# Patient Record
Sex: Male | Born: 1970 | Race: White | Hispanic: No | Marital: Married | Smoking: Current every day smoker
Health system: Southern US, Community
[De-identification: ages and names within clinical notes are randomized; demographics above are authoritative.]

## PROBLEM LIST (undated history)

## (undated) DIAGNOSIS — D751 Secondary polycythemia: Secondary | ICD-10-CM

## (undated) DIAGNOSIS — R7401 Elevation of levels of liver transaminase levels: Secondary | ICD-10-CM

## (undated) DIAGNOSIS — M1990 Unspecified osteoarthritis, unspecified site: Secondary | ICD-10-CM

## (undated) DIAGNOSIS — I10 Essential (primary) hypertension: Secondary | ICD-10-CM

## (undated) DIAGNOSIS — E782 Mixed hyperlipidemia: Secondary | ICD-10-CM

## (undated) DIAGNOSIS — Z1211 Encounter for screening for malignant neoplasm of colon: Secondary | ICD-10-CM

## (undated) DIAGNOSIS — F17200 Nicotine dependence, unspecified, uncomplicated: Secondary | ICD-10-CM

## (undated) HISTORY — DX: Mixed hyperlipidemia: E78.2

## (undated) HISTORY — DX: Secondary polycythemia: D75.1

## (undated) HISTORY — DX: Essential (primary) hypertension: I10

## (undated) HISTORY — DX: Encounter for screening for malignant neoplasm of colon: Z12.11

## (undated) HISTORY — DX: Elevation of levels of liver transaminase levels: R74.01

## (undated) HISTORY — PX: VASECTOMY REVERSAL: SHX243

## (undated) HISTORY — DX: Unspecified osteoarthritis, unspecified site: M19.90

## (undated) HISTORY — PX: OTHER SURGICAL HISTORY: SHX169

## (undated) HISTORY — DX: Nicotine dependence, unspecified, uncomplicated: F17.200

---

## 2007-06-28 ENCOUNTER — Emergency Department (HOSPITAL_COMMUNITY): Admission: EM | Admit: 2007-06-28 | Discharge: 2007-06-29 | Payer: Self-pay | Admitting: Emergency Medicine

## 2010-02-05 ENCOUNTER — Ambulatory Visit: Payer: Self-pay | Admitting: Internal Medicine

## 2017-10-24 DIAGNOSIS — F17210 Nicotine dependence, cigarettes, uncomplicated: Secondary | ICD-10-CM | POA: Insufficient documentation

## 2017-10-31 DIAGNOSIS — M25532 Pain in left wrist: Secondary | ICD-10-CM | POA: Insufficient documentation

## 2018-01-20 ENCOUNTER — Ambulatory Visit: Payer: Managed Care, Other (non HMO) | Admitting: Family Medicine

## 2018-01-20 ENCOUNTER — Encounter: Payer: Self-pay | Admitting: Family Medicine

## 2018-01-20 VITALS — BP 145/79 | HR 83 | Temp 98.5°F | Resp 16 | Ht 66.5 in | Wt 151.2 lb

## 2018-01-20 DIAGNOSIS — I10 Essential (primary) hypertension: Secondary | ICD-10-CM | POA: Diagnosis not present

## 2018-01-20 MED ORDER — HYDROCHLOROTHIAZIDE 25 MG PO TABS: 25.0000 mg | ORAL_TABLET | Freq: Every day | ORAL | 1 refills | Status: DC

## 2018-01-20 NOTE — Patient Instructions (Signed)
Check blood pressure and heart rate once daily and write numbers down so you can bring in the numbers for review with me at follow up in 1 month. Goal blood pressure is <130/80.

## 2018-01-20 NOTE — Progress Notes (Signed)
Office Note 01/22/2018  CC:  Chief Complaint  Patient presents with  . Establish Care    Previous PCP: Dr. Garry Heater at [facility]  . URI  . Elevated BP    HPI:  Calvin Gomez is a 47 y.o. male who is here to establish care and discuss elevated bp and recent respiratory symptoms. Patient's most recent primary MD: see above. Old records were reviewed prior to or during today's visit.  On 11/03/17 pt was seen by Dr. Claiborne Billings for elevated bp w/out HTN dx.  He was advised a trial off of decongestants and NSAIDs. Monitor ambulatory blood pressures closely. Goal BP < 140/90. RTC with blood pressure diary.  Has had cough/congestion for 3-4 months.  Rattly, productive initially, went to UC last week and was rx'd prednisone for a week and now the cough is dry.  It is less frequent and less severe, has some clear mucous production only occasion. No ST or signif nasal sx's.  Gets similar illness every winter, but usually shorter duration and later in the year. Not taking any otc cough med.  No hemoptysis, fevers, or wt loss.  He is a current smoker but wants to try quitting-->has chantix already at home but not sure if he is ready to actually try the med at this time.  About the same time as onset of this cough, his bp was found to be elevated when he was seen in the ED for yellow jacket stings. Up to 150s over 110 per pt's report.  Other times bp 130/80.  Mostly 140s/90s since that time with checking bp at home.    Past Medical History:  Diagnosis Date  . Elevated blood pressure reading without diagnosis of hypertension   . Tobacco dependence     Past Surgical History:  Procedure Laterality Date  . vasectomy    . VASECTOMY REVERSAL      Family History  Problem Relation Age of Onset  . Cancer Mother        Peritoneal Cancer  . Hypertension Father   . Asthma Sister   . Alcohol abuse Brother   . Depression Brother   . Drug abuse Brother   . Early death Brother   .  Heart attack Maternal Grandmother   . Stroke Maternal Grandfather     Social History   Socioeconomic History  . Marital status: Married    Spouse name: Not on file  . Number of children: Not on file  . Years of education: Not on file  . Highest education level: Not on file  Occupational History  . Not on file  Social Needs  . Financial resource strain: Not on file  . Food insecurity:    Worry: Not on file    Inability: Not on file  . Transportation needs:    Medical: Not on file    Non-medical: Not on file  Tobacco Use  . Smoking status: Current Every Day Smoker    Packs/day: 1.00    Years: 20.00    Pack years: 20.00    Types: Cigarettes  . Smokeless tobacco: Never Used  Substance and Sexual Activity  . Alcohol use: Yes    Alcohol/week: 14.0 standard drinks    Types: 14 Shots of liquor per week  . Drug use: Never  . Sexual activity: Not on file  Lifestyle  . Physical activity:    Days per week: Not on file    Minutes per session: Not on file  . Stress:  Not on file  Relationships  . Social connections:    Talks on phone: Not on file    Gets together: Not on file    Attends religious service: Not on file    Active member of club or organization: Not on file    Attends meetings of clubs or organizations: Not on file    Relationship status: Not on file  . Intimate partner violence:    Fear of current or ex partner: Not on file    Emotionally abused: Not on file    Physically abused: Not on file    Forced sexual activity: Not on file  Other Topics Concern  . Not on file  Social History Narrative   Married, 3 girls, 1 boy, 1 girl on the way as of 01/2018.   Educ: some college   Occup: sales with [employer]   Tob: 20 pack-yr hx   Alc: about 2 liquor drinks per night.   No drugs.   MEDS: none  No Known Allergies  ROS Review of Systems  Constitutional: Negative for fatigue and fever.  HENT: Negative for congestion and sore throat.   Eyes: Negative for  visual disturbance.  Respiratory: Negative for cough.   Cardiovascular: Negative for chest pain.  Gastrointestinal: Negative for abdominal pain and nausea.  Genitourinary: Negative for dysuria.  Musculoskeletal: Negative for back pain and joint swelling.  Skin: Negative for rash.  Neurological: Negative for weakness and headaches.  Hematological: Negative for adenopathy.    PE; Blood pressure (!) 145/79, pulse 83, temperature 98.5 F (36.9 C), temperature source Oral, resp. rate 16, height 5' 6.5" (1.689 m), weight 151 lb 4 oz (68.6 kg), SpO2 95 %. Body mass index is 24.05 kg/m.  Gen: Alert, well appearing.  Patient is oriented to person, place, time, and situation. AFFECT: pleasant, lucid thought and speech. EZM:OQHU: no injection, icteris, swelling, or exudate.  EOMI, PERRLA. Mouth: lips without lesion/swelling.  Oral mucosa pink and moist. Oropharynx without erythema, exudate, or swelling.  Neck - No masses or thyromegaly or limitation in range of motion CV: RRR, no m/r/g.   LUNGS: CTA bilat, nonlabored resps, good aeration in all lung fields. ABD: soft, NT/ND EXT: no clubbing or cyanosis.  no edema.   Pertinent labs:  none  ASSESSMENT AND PLAN:   New pt; old records available/reviewed in EMR.  1) New dx HTN; start hctz 25mg  qd.  BMET today.  Monitor bp at home, instructions and handout given.  Encouraged smoking cessation: he has chantix already at home (see HPI).  An After Visit Summary was printed and given to the patient.  Return in about 4 weeks (around 02/17/2018) for f/u HTN.  Signed:  Crissie Sickles, MD           01/22/2018

## 2018-01-21 LAB — COMPREHENSIVE METABOLIC PANEL
AG Ratio: 1.5 (calc) (ref 1.0–2.5)
ALBUMIN MSPROF: 4.1 g/dL (ref 3.6–5.1)
ALT: 47 U/L — ABNORMAL HIGH (ref 9–46)
AST: 37 U/L (ref 10–40)
Alkaline phosphatase (APISO): 92 U/L (ref 40–115)
BILIRUBIN TOTAL: 0.9 mg/dL (ref 0.2–1.2)
BUN: 12 mg/dL (ref 7–25)
CO2: 28 mmol/L (ref 20–32)
Calcium: 9.3 mg/dL (ref 8.6–10.3)
Chloride: 105 mmol/L (ref 98–110)
Creat: 0.9 mg/dL (ref 0.60–1.35)
Globulin: 2.8 g/dL (calc) (ref 1.9–3.7)
Glucose, Bld: 88 mg/dL (ref 65–99)
POTASSIUM: 4.7 mmol/L (ref 3.5–5.3)
Sodium: 140 mmol/L (ref 135–146)
TOTAL PROTEIN: 6.9 g/dL (ref 6.1–8.1)

## 2018-01-21 LAB — TSH: TSH: 1.8 mIU/L (ref 0.40–4.50)

## 2018-01-24 ENCOUNTER — Telehealth: Payer: Self-pay | Admitting: *Deleted

## 2018-01-24 ENCOUNTER — Encounter: Payer: Self-pay | Admitting: *Deleted

## 2018-01-24 NOTE — Telephone Encounter (Signed)
Received medical records from [facility] - Dr. Scarlette Ar.  I reviewed records and abstracted information into pts chart.   Records have been placed on Dr. Idelle Leech desk for review.

## 2018-02-13 ENCOUNTER — Ambulatory Visit: Payer: Managed Care, Other (non HMO) | Admitting: Family Medicine

## 2018-02-13 ENCOUNTER — Encounter: Payer: Self-pay | Admitting: Family Medicine

## 2018-02-13 VITALS — BP 115/73 | HR 87 | Temp 98.2°F | Resp 16 | Ht 66.5 in | Wt 147.5 lb

## 2018-02-13 DIAGNOSIS — Z20828 Contact with and (suspected) exposure to other viral communicable diseases: Secondary | ICD-10-CM

## 2018-02-13 MED ORDER — OSELTAMIVIR PHOSPHATE 75 MG PO CAPS: 75.0000 mg | ORAL_CAPSULE | Freq: Every day | ORAL | 0 refills | Status: DC

## 2018-02-13 NOTE — Progress Notes (Signed)
OFFICE VISIT  02/13/2018   CC:  Chief Complaint  Patient presents with  . Flu exposure    needs Rx for tamiflu due wife scheduled for C-section tomorrow OB/GYN told him to get on tamiflu since pts other children have been Dx w/ flu    HPI:    Patient is a 47 y.o.  male who presents for preventative measures.  He says his 2 children were dx'd with flu and pt has been taking care of them. His wife is scheduled to get a C section tomorrow--her GYN rx'd her tamiflu preventatively.  He needs preventative tamiflu. He has had a mild cough the last couple days and woke up with ST today but this resolved quickly. No fever, no body aches, no HA.  Says he feels well now.   Past Medical History:  Diagnosis Date  . Hypertension   . Tobacco dependence     Past Surgical History:  Procedure Laterality Date  . vasectomy    . VASECTOMY REVERSAL      Outpatient Medications Prior to Visit  Medication Sig Dispense Refill  . hydrochlorothiazide (HYDRODIURIL) 25 MG tablet Take 1 tablet (25 mg total) by mouth daily. 30 tablet 1   No facility-administered medications prior to visit.     No Known Allergies  ROS As per HPI  PE: Blood pressure 115/73, pulse 87, temperature 98.2 F (36.8 C), temperature source Oral, resp. rate 16, height 5' 6.5" (1.689 m), weight 147 lb 8 oz (66.9 kg), SpO2 97 %. Gen: Alert, well appearing.  Patient is oriented to person, place, time, and situation. TGG:YIRS: no injection, icteris, swelling, or exudate.  EOMI, PERRLA. Mouth: lips without lesion/swelling.  Oral mucosa pink and moist. Oropharynx with mild diffuse erythema but no exudate, asymmetry, or swelling.  Neck - No masses or thyromegaly or limitation in range of motion CV: RRR, no m/r/g.   LUNGS: CTA bilat, nonlabored resps, good aeration in all lung fields. EXT: no clubbing or cyanosis.  no edema.     LABS:    Chemistry      Component Value Date/Time   NA 140 01/20/2018 1522   K 4.7 01/20/2018  1522   CL 105 01/20/2018 1522   CO2 28 01/20/2018 1522   BUN 12 01/20/2018 1522   CREATININE 0.90 01/20/2018 1522      Component Value Date/Time   CALCIUM 9.3 01/20/2018 1522   AST 37 01/20/2018 1522   ALT 47 (H) 01/20/2018 1522   BILITOT 0.9 01/20/2018 1522       IMPRESSION AND PLAN:  Influenza exposure. Pt's wife getting C section tomorrow. Start tamiflu 75mg  qd x 10d for pt today.  An After Visit Summary was printed and given to the patient.  FOLLOW UP: Return for keep appt for later this week.  Signed:  Crissie Sickles, MD           02/13/2018

## 2018-02-17 ENCOUNTER — Ambulatory Visit: Payer: 59 | Admitting: Family Medicine

## 2018-02-17 ENCOUNTER — Encounter: Payer: Self-pay | Admitting: Family Medicine

## 2018-02-17 VITALS — BP 134/85 | HR 62 | Temp 97.7°F | Resp 16 | Ht 66.5 in | Wt 149.5 lb

## 2018-02-17 DIAGNOSIS — J209 Acute bronchitis, unspecified: Secondary | ICD-10-CM | POA: Diagnosis not present

## 2018-02-17 DIAGNOSIS — Z720 Tobacco use: Secondary | ICD-10-CM | POA: Diagnosis not present

## 2018-02-17 DIAGNOSIS — I10 Essential (primary) hypertension: Secondary | ICD-10-CM | POA: Diagnosis not present

## 2018-02-17 DIAGNOSIS — J069 Acute upper respiratory infection, unspecified: Secondary | ICD-10-CM | POA: Diagnosis not present

## 2018-02-17 MED ORDER — PREDNISONE 20 MG PO TABS: ORAL_TABLET | ORAL | 0 refills | Status: DC

## 2018-02-17 MED ORDER — LISINOPRIL 10 MG PO TABS: 10.0000 mg | ORAL_TABLET | Freq: Every day | ORAL | 0 refills | Status: DC

## 2018-02-17 NOTE — Progress Notes (Signed)
OFFICE VISIT  02/17/2018   CC:  Chief Complaint  Patient presents with  . Follow-up    HTN   HPI:    Patient is a 48 y.o. Caucasian male who presents for 4 wk f/u HTN. Started hctz 25mg  qd last visit. He recently had another little baby girl!  Measurements outside of medical setting: Upper 130s/low 90s average.  No adverse side effects from med. Still having significant nasal congestion/runny nose, PND, lots of coughing.  No fever, wheezing, or SOB. Mucinex DM not much help.  He continues to smoke cigs.  ROS: no CP, no SOB, no wheezing, no dizziness, no HAs, no rashes, no melena/hematochezia.  No polyuria or polydipsia.  No myalgias or arthralgias.   Past Medical History:  Diagnosis Date  . Hypertension   . Tobacco dependence     Past Surgical History:  Procedure Laterality Date  . vasectomy    . VASECTOMY REVERSAL      Outpatient Medications Prior to Visit  Medication Sig Dispense Refill  . hydrochlorothiazide (HYDRODIURIL) 25 MG tablet Take 1 tablet (25 mg total) by mouth daily. 30 tablet 1  . oseltamivir (TAMIFLU) 75 MG capsule Take 1 capsule (75 mg total) by mouth daily. 10 capsule 0   No facility-administered medications prior to visit.     No Known Allergies  ROS As per HPI  PE: Blood pressure 134/85, pulse 62, temperature 97.7 F (36.5 C), temperature source Oral, resp. rate 16, height 5' 6.5" (1.689 m), weight 149 lb 8 oz (67.8 kg), SpO2 98 %. VS: noted--normal except bp a little up. Gen: alert, NAD, NONTOXIC APPEARING. HEENT: eyes without injection, drainage, or swelling.  Ears: EACs clear, TMs with normal light reflex and landmarks.  Nose: Clear rhinorrhea, with some dried, crusty exudate adherent to mildly injected mucosa.  No purulent d/c.  No paranasal sinus TTP.  No facial swelling.  Throat and mouth without focal lesion.  No pharyngial swelling, erythema, or exudate.   Neck: supple, no LAD.   LUNGS: Rattly insp/exp rhonchi diffusely-->completely  clears with forceful coughing--> CTA bilat, nonlabored resps.   CV: RRR, no m/r/g. EXT: no c/c/e SKIN: no rash   LABS:    Chemistry      Component Value Date/Time   NA 140 01/20/2018 1522   K 4.7 01/20/2018 1522   CL 105 01/20/2018 1522   CO2 28 01/20/2018 1522   BUN 12 01/20/2018 1522   CREATININE 0.90 01/20/2018 1522      Component Value Date/Time   CALCIUM 9.3 01/20/2018 1522   AST 37 01/20/2018 1522   ALT 47 (H) 01/20/2018 1522   BILITOT 0.9 01/20/2018 1522       IMPRESSION AND PLAN:  1) HTN, not ideal control. Continue hctz 25mg  qd. Add lisinopril.  Instructions: Add lisinopril 10mg -->HALF tab once a day.  If average blood pressure not 130 on top and 80 on bottom, then increase to WHOLE tab. Check BMET at f/u in 1 mo.  2) Prolonged URI with acute bronchitis, +tob abuse. Prednisone 40mg  qd x 5d, then 20mg  qd x 5d. Further instructions: Pick up generic over the counter flonase and follow instructions on packaging. Pick up generic over the counter saline nasal spray, use 2 sprays each nostril 3-4 times a day to irrigate and moisturize your nasal passages. I mentioned that he could also try any nonsedating otc antihistamine (without sudafed). Work on quitting smoking!  An After Visit Summary was printed and given to the patient.  FOLLOW UP:  Return in about 4 weeks (around 03/17/2018) for f/u HTN/resp illness/needs BMET.  Signed:  Crissie Sickles, MD           02/17/2018

## 2018-02-17 NOTE — Patient Instructions (Signed)
  Continue HCTZ 25mg  tab once daily. Add lisinopril 10mg -->HALF tab once a day.  If average blood pressure not 130 on top and 80 on bottom, then increase to WHOLE tab.  Pick up generic over the counter flonase and follow instructions on packaging. Pick up generic over the counter saline nasal spray, use 2 sprays each nostril 3-4 times a day to irrigate and moisturize your nasal passages.  Work on quitting smoking!

## 2018-03-12 ENCOUNTER — Other Ambulatory Visit: Payer: Self-pay | Admitting: Family Medicine

## 2018-03-17 ENCOUNTER — Ambulatory Visit: Payer: 59 | Admitting: Family Medicine

## 2018-03-17 ENCOUNTER — Encounter: Payer: Self-pay | Admitting: Family Medicine

## 2018-03-17 VITALS — BP 132/83 | HR 73 | Temp 98.0°F | Resp 16 | Ht 66.5 in | Wt 158.5 lb

## 2018-03-17 DIAGNOSIS — J410 Simple chronic bronchitis: Secondary | ICD-10-CM

## 2018-03-17 DIAGNOSIS — F17200 Nicotine dependence, unspecified, uncomplicated: Secondary | ICD-10-CM

## 2018-03-17 DIAGNOSIS — I10 Essential (primary) hypertension: Secondary | ICD-10-CM

## 2018-03-17 MED ORDER — LISINOPRIL 20 MG PO TABS: 20.0000 mg | ORAL_TABLET | Freq: Every day | ORAL | 0 refills | Status: DC

## 2018-03-17 NOTE — Progress Notes (Signed)
OFFICE VISIT  03/17/2018   CC:  Chief Complaint  Patient presents with  . Follow-up    HTN and Resp. Illness     HPI:    Patient is a 48 y.o.  male who presents for f/u HTN and recent resp illness. I last saw him about a month ago for this.  HTN: last visit I added lisinopril 5mg , with instructions to titrate up to 10mg  qd if bp not at goal of 130/80 consistently.    I also dx'd him with prolonged bronchitis and rx'd him pred 40 x 5, 20 x 5.  Recommended flonase and nonsedating antihistamine w/out sudafed.  He is a smoker.  Interim hx:  Daily home bp's avg 140s/90s and also some 093A systolic.  Lowest 113/76.  No recollection of HR data. Not exercising or eating a great diet.  Congestion/cough is a problem mainly only in the morning at this time.  He has no chest tightness or SOB. Occ wheeze that lasts only a few seconds. +smoking still.  Past Medical History:  Diagnosis Date  . Hypertension   . Tobacco dependence     Past Surgical History:  Procedure Laterality Date  . vasectomy    . VASECTOMY REVERSAL      Outpatient Medications Prior to Visit  Medication Sig Dispense Refill  . hydrochlorothiazide (HYDRODIURIL) 25 MG tablet Take 1 tablet (25 mg total) by mouth daily. 30 tablet 1  . lisinopril (PRINIVIL,ZESTRIL) 10 MG tablet TAKE 1 TABLET BY MOUTH EVERY DAY 30 tablet 0  . oseltamivir (TAMIFLU) 75 MG capsule Take 1 capsule (75 mg total) by mouth daily. (Patient not taking: Reported on 03/17/2018) 10 capsule 0  . predniSONE (DELTASONE) 20 MG tablet 2 tabs po qd x 5d, then 1 tab po qd x 5d (Patient not taking: Reported on 03/17/2018) 15 tablet 0   No facility-administered medications prior to visit.     No Known Allergies  ROS As per HPI  PE: Blood pressure 132/83, pulse 73, temperature 98 F (36.7 C), temperature source Oral, resp. rate 16, height 5' 6.5" (1.689 m), weight 158 lb 8 oz (71.9 kg), SpO2 100 %. Gen: Alert, well appearing.  Patient is oriented to  person, place, time, and situation. AFFECT: pleasant, lucid thought and speech. TFT:DDUK: no injection, icteris, swelling, or exudate.  EOMI, PERRLA. Mouth: lips without lesion/swelling.  Oral mucosa pink and moist. Oropharynx without erythema, exudate, or swelling.  CV: RRR, no m/r/g.   LUNGS: CTA bilat, nonlabored resps, good aeration in all lung fields. EXT: no clubbing or cyanosis.  no edema.    LABS:    Chemistry      Component Value Date/Time   NA 140 01/20/2018 1522   K 4.7 01/20/2018 1522   CL 105 01/20/2018 1522   CO2 28 01/20/2018 1522   BUN 12 01/20/2018 1522   CREATININE 0.90 01/20/2018 1522      Component Value Date/Time   CALCIUM 9.3 01/20/2018 1522   AST 37 01/20/2018 1522   ALT 47 (H) 01/20/2018 1522   BILITOT 0.9 01/20/2018 1522       IMPRESSION AND PLAN:  1) uncontrolled htn: increase lisinopril to 20 mg qd. Encouraged smoking cessation, increase exercise, and eat low Na diet. BMET today. Continue daily home bp monitoring. Goal bp <130/80.  2) Chronic bronchitis: I think he is over the recent acute flare. He essentially has smoker's bronchitis and I told him he will continue to have current sx's and recurrent flares of acute  bronchitis as long as he continues to smoke cigarettes.  He expressed understanding but has no plans to attempt quitting at this time.  An After Visit Summary was printed and given to the patient.  FOLLOW UP: Return in about 4 weeks (around 04/14/2018) for f/u HTN.  Signed:  Crissie Sickles, MD           03/17/2018

## 2018-03-18 ENCOUNTER — Other Ambulatory Visit: Payer: Self-pay | Admitting: Family Medicine

## 2018-03-18 LAB — BASIC METABOLIC PANEL
BUN: 13 mg/dL (ref 7–25)
CO2: 28 mmol/L (ref 20–32)
Calcium: 10.2 mg/dL (ref 8.6–10.3)
Chloride: 98 mmol/L (ref 98–110)
Creat: 0.92 mg/dL (ref 0.60–1.35)
Glucose, Bld: 80 mg/dL (ref 65–99)
Potassium: 4.5 mmol/L (ref 3.5–5.3)
Sodium: 135 mmol/L (ref 135–146)

## 2018-03-20 ENCOUNTER — Encounter: Payer: Self-pay | Admitting: *Deleted

## 2018-04-14 ENCOUNTER — Ambulatory Visit: Payer: 59 | Admitting: Family Medicine

## 2018-04-15 ENCOUNTER — Other Ambulatory Visit: Payer: Self-pay | Admitting: Family Medicine

## 2018-04-17 NOTE — Telephone Encounter (Signed)
Received 2 refill requests for BP meds (Lisinopril and HCTZ) from patient's pharmacy. Called patient to make sure he had enough and confirmed he did.

## 2018-04-20 ENCOUNTER — Ambulatory Visit: Payer: 59 | Admitting: Family Medicine

## 2018-04-20 NOTE — Progress Notes (Deleted)
OFFICE VISIT  04/20/2018   CC: No chief complaint on file.    HPI:    Patient is a 48 y.o. Caucasian male who presents for 1 month f/u HTN. Last visit his bp was not at goal so I increased lisinopril from 10mg  to 20mg  daily. BMET at that time was normal.  Interim Hx: ***  Past Medical History:  Diagnosis Date  . Hypertension   . Tobacco dependence     Past Surgical History:  Procedure Laterality Date  . vasectomy    . VASECTOMY REVERSAL      Outpatient Medications Prior to Visit  Medication Sig Dispense Refill  . hydrochlorothiazide (HYDRODIURIL) 25 MG tablet TAKE 1 TABLET BY MOUTH EVERY DAY 30 tablet 0  . lisinopril (PRINIVIL,ZESTRIL) 20 MG tablet Take 1 tablet (20 mg total) by mouth daily. 30 tablet 0   No facility-administered medications prior to visit.     No Known Allergies  ROS As per HPI  PE: There were no vitals taken for this visit. ***  LABS:    Chemistry      Component Value Date/Time   NA 135 03/17/2018 1027   K 4.5 03/17/2018 1027   CL 98 03/17/2018 1027   CO2 28 03/17/2018 1027   BUN 13 03/17/2018 1027   CREATININE 0.92 03/17/2018 1027      Component Value Date/Time   CALCIUM 10.2 03/17/2018 1027   AST 37 01/20/2018 1522   ALT 47 (H) 01/20/2018 1522   BILITOT 0.9 01/20/2018 1522       IMPRESSION AND PLAN:  No problem-specific Assessment & Plan notes found for this encounter.   An After Visit Summary was printed and given to the patient.  FOLLOW UP: No follow-ups on file.  Signed:  Crissie Sickles, MD           04/20/2018

## 2018-04-24 ENCOUNTER — Ambulatory Visit: Payer: 59 | Admitting: Family Medicine

## 2018-04-24 ENCOUNTER — Encounter: Payer: Self-pay | Admitting: Family Medicine

## 2018-04-24 VITALS — BP 105/69 | HR 83 | Temp 98.5°F | Resp 16 | Ht 66.5 in | Wt 154.8 lb

## 2018-04-24 DIAGNOSIS — I10 Essential (primary) hypertension: Secondary | ICD-10-CM | POA: Diagnosis not present

## 2018-04-24 NOTE — Patient Instructions (Signed)
DASH Eating Plan  DASH stands for "Dietary Approaches to Stop Hypertension." The DASH eating plan is a healthy eating plan that has been shown to reduce high blood pressure (hypertension). It may also reduce your risk for type 2 diabetes, heart disease, and stroke. The DASH eating plan may also help with weight loss.  What are tips for following this plan?    General guidelines   Avoid eating more than 2,300 mg (milligrams) of salt (sodium) a day. If you have hypertension, you may need to reduce your sodium intake to 1,500 mg a day.   Limit alcohol intake to no more than 1 drink a day for nonpregnant women and 2 drinks a day for men. One drink equals 12 oz of beer, 5 oz of wine, or 1 oz of hard liquor.   Work with your health care provider to maintain a healthy body weight or to lose weight. Ask what an ideal weight is for you.   Get at least 30 minutes of exercise that causes your heart to beat faster (aerobic exercise) most days of the week. Activities may include walking, swimming, or biking.   Work with your health care provider or diet and nutrition specialist (dietitian) to adjust your eating plan to your individual calorie needs.  Reading food labels     Check food labels for the amount of sodium per serving. Choose foods with less than 5 percent of the Daily Value of sodium. Generally, foods with less than 300 mg of sodium per serving fit into this eating plan.   To find whole grains, look for the word "whole" as the first word in the ingredient list.  Shopping   Buy products labeled as "low-sodium" or "no salt added."   Buy fresh foods. Avoid canned foods and premade or frozen meals.  Cooking   Avoid adding salt when cooking. Use salt-free seasonings or herbs instead of table salt or sea salt. Check with your health care provider or pharmacist before using salt substitutes.   Do not fry foods. Cook foods using healthy methods such as baking, boiling, grilling, and broiling instead.   Cook with  heart-healthy oils, such as olive, canola, soybean, or sunflower oil.  Meal planning   Eat a balanced diet that includes:  ? 5 or more servings of fruits and vegetables each day. At each meal, try to fill half of your plate with fruits and vegetables.  ? Up to 6-8 servings of whole grains each day.  ? Less than 6 oz of lean meat, poultry, or fish each day. A 3-oz serving of meat is about the same size as a deck of cards. One egg equals 1 oz.  ? 2 servings of low-fat dairy each day.  ? A serving of nuts, seeds, or beans 5 times each week.  ? Heart-healthy fats. Healthy fats called Omega-3 fatty acids are found in foods such as flaxseeds and coldwater fish, like sardines, salmon, and mackerel.   Limit how much you eat of the following:  ? Canned or prepackaged foods.  ? Food that is high in trans fat, such as fried foods.  ? Food that is high in saturated fat, such as fatty meat.  ? Sweets, desserts, sugary drinks, and other foods with added sugar.  ? Full-fat dairy products.   Do not salt foods before eating.   Try to eat at least 2 vegetarian meals each week.   Eat more home-cooked food and less restaurant, buffet, and fast food.     When eating at a restaurant, ask that your food be prepared with less salt or no salt, if possible.  What foods are recommended?  The items listed may not be a complete list. Talk with your dietitian about what dietary choices are best for you.  Grains  Whole-grain or whole-wheat bread. Whole-grain or whole-wheat pasta. Brown rice. Oatmeal. Quinoa. Bulgur. Whole-grain and low-sodium cereals. Pita bread. Low-fat, low-sodium crackers. Whole-wheat flour tortillas.  Vegetables  Fresh or frozen vegetables (raw, steamed, roasted, or grilled). Low-sodium or reduced-sodium tomato and vegetable juice. Low-sodium or reduced-sodium tomato sauce and tomato paste. Low-sodium or reduced-sodium canned vegetables.  Fruits  All fresh, dried, or frozen fruit. Canned fruit in natural juice (without  added sugar).  Meat and other protein foods  Skinless chicken or turkey. Ground chicken or turkey. Pork with fat trimmed off. Fish and seafood. Egg whites. Dried beans, peas, or lentils. Unsalted nuts, nut butters, and seeds. Unsalted canned beans. Lean cuts of beef with fat trimmed off. Low-sodium, lean deli meat.  Dairy  Low-fat (1%) or fat-free (skim) milk. Fat-free, low-fat, or reduced-fat cheeses. Nonfat, low-sodium ricotta or cottage cheese. Low-fat or nonfat yogurt. Low-fat, low-sodium cheese.  Fats and oils  Soft margarine without trans fats. Vegetable oil. Low-fat, reduced-fat, or light mayonnaise and salad dressings (reduced-sodium). Canola, safflower, olive, soybean, and sunflower oils. Avocado.  Seasoning and other foods  Herbs. Spices. Seasoning mixes without salt. Unsalted popcorn and pretzels. Fat-free sweets.  What foods are not recommended?  The items listed may not be a complete list. Talk with your dietitian about what dietary choices are best for you.  Grains  Baked goods made with fat, such as croissants, muffins, or some breads. Dry pasta or rice meal packs.  Vegetables  Creamed or fried vegetables. Vegetables in a cheese sauce. Regular canned vegetables (not low-sodium or reduced-sodium). Regular canned tomato sauce and paste (not low-sodium or reduced-sodium). Regular tomato and vegetable juice (not low-sodium or reduced-sodium). Pickles. Olives.  Fruits  Canned fruit in a light or heavy syrup. Fried fruit. Fruit in cream or butter sauce.  Meat and other protein foods  Fatty cuts of meat. Ribs. Fried meat. Bacon. Sausage. Bologna and other processed lunch meats. Salami. Fatback. Hotdogs. Bratwurst. Salted nuts and seeds. Canned beans with added salt. Canned or smoked fish. Whole eggs or egg yolks. Chicken or turkey with skin.  Dairy  Whole or 2% milk, cream, and half-and-half. Whole or full-fat cream cheese. Whole-fat or sweetened yogurt. Full-fat cheese. Nondairy creamers. Whipped toppings.  Processed cheese and cheese spreads.  Fats and oils  Butter. Stick margarine. Lard. Shortening. Ghee. Bacon fat. Tropical oils, such as coconut, palm kernel, or palm oil.  Seasoning and other foods  Salted popcorn and pretzels. Onion salt, garlic salt, seasoned salt, table salt, and sea salt. Worcestershire sauce. Tartar sauce. Barbecue sauce. Teriyaki sauce. Soy sauce, including reduced-sodium. Steak sauce. Canned and packaged gravies. Fish sauce. Oyster sauce. Cocktail sauce. Horseradish that you find on the shelf. Ketchup. Mustard. Meat flavorings and tenderizers. Bouillon cubes. Hot sauce and Tabasco sauce. Premade or packaged marinades. Premade or packaged taco seasonings. Relishes. Regular salad dressings.  Where to find more information:   National Heart, Lung, and Blood Institute: www.nhlbi.nih.gov   American Heart Association: www.heart.org  Summary   The DASH eating plan is a healthy eating plan that has been shown to reduce high blood pressure (hypertension). It may also reduce your risk for type 2 diabetes, heart disease, and stroke.   With the   DASH eating plan, you should limit salt (sodium) intake to 2,300 mg a day. If you have hypertension, you may need to reduce your sodium intake to 1,500 mg a day.   When on the DASH eating plan, aim to eat more fresh fruits and vegetables, whole grains, lean proteins, low-fat dairy, and heart-healthy fats.   Work with your health care provider or diet and nutrition specialist (dietitian) to adjust your eating plan to your individual calorie needs.  This information is not intended to replace advice given to you by your health care provider. Make sure you discuss any questions you have with your health care provider.  Document Released: 01/21/2011 Document Revised: 01/26/2016 Document Reviewed: 01/26/2016  Elsevier Interactive Patient Education  2019 [employer] Inc[employer]

## 2018-04-24 NOTE — Progress Notes (Signed)
OFFICE VISIT  04/24/2018   CC:  Chief Complaint  Patient presents with  . Follow-up    hypertension     HPI:    Patient is a 48 y.o. Caucasian male who presents for 1 mo f/u HTN. Increased lisinopril to 20mg  qd last visit. BMET at that time was normal.  Home bp monitoring showed great reponse 118-125 over 70s-80. No adverse effects from the meds.  No acute complaints.   Discussed low sodium/DASH diet, handout given. Still smoking.  Past Medical History:  Diagnosis Date  . Hypertension   . Tobacco dependence     Past Surgical History:  Procedure Laterality Date  . vasectomy    . VASECTOMY REVERSAL      Outpatient Medications Prior to Visit  Medication Sig Dispense Refill  . hydrochlorothiazide (HYDRODIURIL) 25 MG tablet TAKE 1 TABLET BY MOUTH EVERY DAY 30 tablet 0  . lisinopril (PRINIVIL,ZESTRIL) 20 MG tablet Take 1 tablet (20 mg total) by mouth daily. 30 tablet 0   No facility-administered medications prior to visit.     No Known Allergies  ROS As per HPI  PE: Blood pressure 105/69, pulse 83, temperature 98.5 F (36.9 C), temperature source Oral, resp. rate 16, height 5' 6.5" (1.689 m), weight 154 lb 12.8 oz (70.2 kg), SpO2 98 %. Body mass index is 24.61 kg/m.  Gen: Alert, well appearing.  Patient is oriented to person, place, time, and situation. AFFECT: pleasant, lucid thought and speech. CV: RRR, no m/r/g.   LUNGS: CTA bilat, nonlabored resps, good aeration in all lung fields. EXT: no clubbing or cyanosis.  no edema.    LABS:    Chemistry      Component Value Date/Time   NA 135 03/17/2018 1027   K 4.5 03/17/2018 1027   CL 98 03/17/2018 1027   CO2 28 03/17/2018 1027   BUN 13 03/17/2018 1027   CREATININE 0.92 03/17/2018 1027      Component Value Date/Time   CALCIUM 10.2 03/17/2018 1027   AST 37 01/20/2018 1522   ALT 47 (H) 01/20/2018 1522   BILITOT 0.9 01/20/2018 1522       IMPRESSION AND PLAN:  Ess HTN: well controlled on current  regimen. No labs today. RF'd hctz 25mg  qd and lisinopril 20mg  qd today. DASH diet reviewed, handout given. Encouraged pt to increase exercise and totally quit smoking.  An After Visit Summary was printed and given to the patient.  FOLLOW UP: Return in about 6 months (around 10/25/2018) for annual CPE (fasting).  Signed:  Crissie Sickles, MD           04/24/2018

## 2018-04-27 ENCOUNTER — Encounter: Payer: Self-pay | Admitting: Family Medicine

## 2018-04-27 NOTE — Telephone Encounter (Signed)
Please advise. Thanks.  

## 2018-04-28 MED ORDER — VARENICLINE TARTRATE 0.5 MG PO TABS: 0.5000 mg | ORAL_TABLET | Freq: Two times a day (BID) | ORAL | 0 refills | Status: DC

## 2018-04-28 NOTE — Telephone Encounter (Signed)
Will you pls send in chantix "starter pack" to pt's pharmacy. Tell pt to call and request a "continuation prescription" if he is still on the med at the end of his starter pack.-thx

## 2018-05-18 ENCOUNTER — Other Ambulatory Visit: Payer: Self-pay | Admitting: Family Medicine

## 2018-05-19 ENCOUNTER — Other Ambulatory Visit: Payer: Self-pay

## 2018-05-19 MED ORDER — HYDROCHLOROTHIAZIDE 25 MG PO TABS: 25.0000 mg | ORAL_TABLET | Freq: Every day | ORAL | 3 refills | Status: DC

## 2018-05-19 MED ORDER — LISINOPRIL 20 MG PO TABS: 20.0000 mg | ORAL_TABLET | Freq: Every day | ORAL | 3 refills | Status: DC

## 2018-06-24 ENCOUNTER — Other Ambulatory Visit: Payer: Self-pay | Admitting: Family Medicine

## 2018-06-26 NOTE — Telephone Encounter (Signed)
I'm really confused:  I rx'd a chantix started pack 04/2018.  Ask pt, did he take the starter pack?  This RF request has 0.5mg  bid dosing on it, but the maintenance dose is 1 mg bid.   Let me know-thx

## 2018-06-27 NOTE — Telephone Encounter (Signed)
Called pt and LM to return call to find out if pt has been taking medication (starter pack) and is needing the maintenance dose

## 2018-09-27 ENCOUNTER — Encounter: Payer: Self-pay | Admitting: Family Medicine

## 2018-09-27 ENCOUNTER — Other Ambulatory Visit: Payer: Self-pay

## 2018-09-27 ENCOUNTER — Ambulatory Visit (INDEPENDENT_AMBULATORY_CARE_PROVIDER_SITE_OTHER): Payer: No Typology Code available for payment source | Admitting: Family Medicine

## 2018-09-27 VITALS — BP 136/91 | HR 74 | Temp 97.8°F | Wt 157.4 lb

## 2018-09-27 DIAGNOSIS — R1030 Lower abdominal pain, unspecified: Secondary | ICD-10-CM | POA: Diagnosis not present

## 2018-09-27 DIAGNOSIS — Z720 Tobacco use: Secondary | ICD-10-CM

## 2018-09-27 DIAGNOSIS — R197 Diarrhea, unspecified: Secondary | ICD-10-CM | POA: Diagnosis not present

## 2018-09-27 DIAGNOSIS — R05 Cough: Secondary | ICD-10-CM | POA: Diagnosis not present

## 2018-09-27 DIAGNOSIS — R053 Chronic cough: Secondary | ICD-10-CM

## 2018-09-27 MED ORDER — BENZONATATE 200 MG PO CAPS: 200.0000 mg | ORAL_CAPSULE | Freq: Two times a day (BID) | ORAL | 2 refills | Status: DC | PRN

## 2018-09-27 NOTE — Progress Notes (Signed)
Virtual Visit via Video Note  I connected with pt on 09/27/18 at 10:00 AM EDT by a video enabled telemedicine application and verified that I am speaking with the correct person using two identifiers.  Location patient: home Location provider:work or home office Persons participating in the virtual visit: patient, provider  I discussed the limitations of evaluation and management by telemedicine and the availability of in person appointments. The patient expressed understanding and agreed to proceed.  Telemedicine visit is a necessity given the COVID-19 restrictions in place at the current time.  HPI: 48 y/o male being seen today for  abd pain and diarrhea. Onset 2 wks ago diffuse lower abd pain and loose stools.  Says lower abd is "tender" but no bloating or cramping sensation.  No blood in stools.  Has 3-4 BMs per day. Last few days drinking more water, less soda.  Urination is normal. Appetite down, sometimes eats only 1 meal per day.  No n/v/fever. No known sick contacts. Has never had this type of problem before.    Has chronic cough that causes lower abd pain to be worse. Longtime smoker.  Says he has "wheezing" which per his description sounds like rhonchorus sounds that clear with brief coughing.  No SOB or hemoptysis. Has taken prednis in the past but no help. OTC cough meds no help.   ROS: no CP, no SOB, no dizziness, no HAs, no rashes, no melena/hematochezia.  No polyuria or polydipsia.  No myalgias or arthralgias.   Past Medical History:  Diagnosis Date  . Hypertension   . Tobacco dependence     Past Surgical History:  Procedure Laterality Date  . vasectomy    . VASECTOMY REVERSAL      Family History  Problem Relation Age of Onset  . Cancer Mother        Peritoneal Cancer  . Hypertension Father   . Stroke Father   . Asthma Sister   . Alcohol abuse Brother   . Depression Brother   . Drug abuse Brother   . Early death Brother   . Heart attack Maternal  Grandmother   . Stroke Maternal Grandmother   . Stroke Maternal Grandfather   . Heart disease Maternal Grandfather   . Asthma Daughter     SOCIAL HX:  Social History   Socioeconomic History  . Marital status: Married    Spouse name: Not on file  . Number of children: Not on file  . Years of education: Not on file  . Highest education level: Not on file  Occupational History  . Not on file  Social Needs  . Financial resource strain: Not on file  . Food insecurity    Worry: Not on file    Inability: Not on file  . Transportation needs    Medical: Not on file    Non-medical: Not on file  Tobacco Use  . Smoking status: Current Every Day Smoker    Packs/day: 1.00    Years: 20.00    Pack years: 20.00    Types: Cigarettes  . Smokeless tobacco: Never Used  Substance and Sexual Activity  . Alcohol use: Yes    Alcohol/week: 14.0 standard drinks    Types: 14 Shots of liquor per week  . Drug use: Never  . Sexual activity: Not on file  Lifestyle  . Physical activity    Days per week: Not on file    Minutes per session: Not on file  . Stress: Not on file  Relationships  . Social Herbalist on phone: Not on file    Gets together: Not on file    Attends religious service: Not on file    Active member of club or organization: Not on file    Attends meetings of clubs or organizations: Not on file    Relationship status: Not on file  Other Topics Concern  . Not on file  Social History Narrative   Married, 4 girls, 1 boy.   Educ: some college   Occup: sales with [employer]   Tob: 20 pack-yr hx, current as of 02/2018.   Alc: about 2 liquor drinks per night.   No drugs.      Current Outpatient Medications:  .  hydrochlorothiazide (HYDRODIURIL) 25 MG tablet, Take 1 tablet (25 mg total) by mouth daily., Disp: 30 tablet, Rfl: 3 .  lisinopril (ZESTRIL) 10 MG tablet, Take 10 mg by mouth once. Pt takes 2 tablets daily., Disp: , Rfl:  .  lisinopril (PRINIVIL,ZESTRIL)  20 MG tablet, Take 1 tablet (20 mg total) by mouth daily. (Patient not taking: Reported on 09/27/2018), Disp: 30 tablet, Rfl: 3 .  varenicline (CHANTIX) 0.5 MG tablet, Take 1 tablet (0.5 mg total) by mouth 2 (two) times daily. (Patient not taking: Reported on 09/27/2018), Disp: 60 tablet, Rfl: 0  EXAM:  VITALS per patient if applicable: BP (!) 569/79 (BP Location: Left Arm, Patient Position: Sitting, Cuff Size: Normal)   Pulse 74   Temp 97.8 F (36.6 C) (Oral)   Wt 157 lb 6.4 oz (71.4 kg)   BMI 25.02 kg/m    GENERAL: alert, oriented, appears well and in no acute distress  HEENT: atraumatic, conjunttiva clear, no obvious abnormalities on inspection of external nose and ears  NECK: normal movements of the head and neck  LUNGS: on inspection no signs of respiratory distress, breathing rate appears normal, no obvious gross SOB, gasping or wheezing  CV: no obvious cyanosis ABD: when pt presses on lower abd he has mild TTP in RLQ but nowhere else.  MS: moves all visible extremities without noticeable abnormality  PSYCH/NEURO: pleasant and cooperative, no obvious depression or anxiety, speech and thought processing grossly intact  ASSESSMENT AND PLAN:  Discussed the following assessment and plan:  1) Lower abd pain: colitis vs abd wall pain. Acute/subacute diarrhea associated with this. Will check CBC w/diff, CMET, and stool studies (o&p, giardia, c diff, bact culture). No meds recommended for this problem at this time.  2) Chronic cough.  Suspect chronic upper airway cough from smoking but also likely some chronic bronchitis as well.  Will check CXR--[place].Lavella Lemons pearls 200 mg tid prn eRxd today, #30, rF x 2.  I discussed the assessment and treatment plan with the patient. The patient was provided an opportunity to ask questions and all were answered. The patient agreed with the plan and demonstrated an understanding of the instructions.   The patient was advised to call  back or seek an in-person evaluation if the symptoms worsen or if the condition fails to improve as anticipated.  F/u: will determine when I need to have him come in for exam after I get results of testing.  Signed:  Crissie Sickles, MD           09/27/2018

## 2018-09-29 ENCOUNTER — Ambulatory Visit (INDEPENDENT_AMBULATORY_CARE_PROVIDER_SITE_OTHER): Payer: No Typology Code available for payment source

## 2018-09-29 ENCOUNTER — Other Ambulatory Visit: Payer: No Typology Code available for payment source

## 2018-09-29 DIAGNOSIS — Z720 Tobacco use: Secondary | ICD-10-CM

## 2018-09-29 DIAGNOSIS — R05 Cough: Secondary | ICD-10-CM

## 2018-09-29 DIAGNOSIS — R053 Chronic cough: Secondary | ICD-10-CM

## 2018-10-02 ENCOUNTER — Telehealth: Payer: Self-pay

## 2018-10-02 NOTE — Telephone Encounter (Signed)
MyChart message sent with results.  

## 2018-10-03 ENCOUNTER — Telehealth: Payer: Self-pay

## 2018-10-03 ENCOUNTER — Other Ambulatory Visit: Payer: No Typology Code available for payment source

## 2018-10-03 ENCOUNTER — Ambulatory Visit (INDEPENDENT_AMBULATORY_CARE_PROVIDER_SITE_OTHER): Payer: No Typology Code available for payment source

## 2018-10-03 ENCOUNTER — Other Ambulatory Visit: Payer: Self-pay

## 2018-10-03 DIAGNOSIS — R1030 Lower abdominal pain, unspecified: Secondary | ICD-10-CM | POA: Diagnosis not present

## 2018-10-03 DIAGNOSIS — R7401 Elevation of levels of liver transaminase levels: Secondary | ICD-10-CM

## 2018-10-03 DIAGNOSIS — R197 Diarrhea, unspecified: Secondary | ICD-10-CM | POA: Diagnosis not present

## 2018-10-03 LAB — CBC WITH DIFFERENTIAL/PLATELET
Basophils Absolute: 0.1 10*3/uL (ref 0.0–0.1)
Basophils Relative: 0.8 % (ref 0.0–3.0)
Eosinophils Absolute: 0.4 10*3/uL (ref 0.0–0.7)
Eosinophils Relative: 4.3 % (ref 0.0–5.0)
HCT: 55.1 % — ABNORMAL HIGH (ref 39.0–52.0)
Hemoglobin: 18.6 g/dL (ref 13.0–17.0)
Lymphocytes Relative: 28.5 % (ref 12.0–46.0)
Lymphs Abs: 2.4 10*3/uL (ref 0.7–4.0)
MCHC: 33.8 g/dL (ref 30.0–36.0)
MCV: 98 fl (ref 78.0–100.0)
Monocytes Absolute: 1.1 10*3/uL — ABNORMAL HIGH (ref 0.1–1.0)
Monocytes Relative: 12.9 % — ABNORMAL HIGH (ref 3.0–12.0)
Neutro Abs: 4.5 10*3/uL (ref 1.4–7.7)
Neutrophils Relative %: 53.5 % (ref 43.0–77.0)
Platelets: 211 10*3/uL (ref 150.0–400.0)
RBC: 5.62 Mil/uL (ref 4.22–5.81)
RDW: 14.4 % (ref 11.5–15.5)
WBC: 8.4 10*3/uL (ref 4.0–10.5)

## 2018-10-03 LAB — COMPREHENSIVE METABOLIC PANEL
ALT: 127 U/L — ABNORMAL HIGH (ref 0–53)
AST: 87 U/L — ABNORMAL HIGH (ref 0–37)
Albumin: 4.9 g/dL (ref 3.5–5.2)
Alkaline Phosphatase: 79 U/L (ref 39–117)
BUN: 11 mg/dL (ref 6–23)
CO2: 28 mEq/L (ref 19–32)
Calcium: 10.3 mg/dL (ref 8.4–10.5)
Chloride: 98 mEq/L (ref 96–112)
Creatinine, Ser: 0.88 mg/dL (ref 0.40–1.50)
GFR: 93.17 mL/min (ref >60.00)
Glucose, Bld: 113 mg/dL — ABNORMAL HIGH (ref 70–99)
Potassium: 3.7 mEq/L (ref 3.5–5.1)
Sodium: 135 mEq/L (ref 135–145)
Total Bilirubin: 0.8 mg/dL (ref 0.2–1.2)
Total Protein: 7.7 g/dL (ref 6.0–8.3)

## 2018-10-03 NOTE — Telephone Encounter (Signed)
CRITICAL VALUE STICKER  CRITICAL VALUE: Hgb 18.6  RECEIVER (on-site recipient of call): Deveron Furlong, [place] NOTIFIED: 10/03/18 at 3:34pm  MESSENGER (representative from lab): Santiago Glad  MD NOTIFIED: [place]: 3:40pm  RESPONSE:

## 2018-10-04 LAB — HEPATITIS C ANTIBODY
Hepatitis C Ab: NONREACTIVE
SIGNAL TO CUT-OFF: 0.02 (ref <1.00)

## 2018-10-04 LAB — HEPATITIS B SURFACE ANTIGEN: Hepatitis B Surface Ag: NONREACTIVE

## 2018-10-04 NOTE — Telephone Encounter (Signed)
Noted.  Result note has been done/pt aware.

## 2018-10-06 ENCOUNTER — Other Ambulatory Visit: Payer: Self-pay

## 2018-10-06 ENCOUNTER — Ambulatory Visit: Payer: No Typology Code available for payment source | Admitting: Family Medicine

## 2018-10-06 ENCOUNTER — Encounter: Payer: Self-pay | Admitting: Family Medicine

## 2018-10-06 VITALS — BP 124/77 | HR 71 | Temp 98.5°F | Resp 16 | Ht 66.5 in | Wt 159.4 lb

## 2018-10-06 DIAGNOSIS — J42 Unspecified chronic bronchitis: Secondary | ICD-10-CM

## 2018-10-06 DIAGNOSIS — B349 Viral infection, unspecified: Secondary | ICD-10-CM

## 2018-10-06 DIAGNOSIS — R740 Nonspecific elevation of levels of transaminase and lactic acid dehydrogenase [LDH]: Secondary | ICD-10-CM | POA: Diagnosis not present

## 2018-10-06 DIAGNOSIS — D751 Secondary polycythemia: Secondary | ICD-10-CM | POA: Diagnosis not present

## 2018-10-06 DIAGNOSIS — F17200 Nicotine dependence, unspecified, uncomplicated: Secondary | ICD-10-CM

## 2018-10-06 DIAGNOSIS — R7401 Elevation of levels of liver transaminase levels: Secondary | ICD-10-CM

## 2018-10-06 DIAGNOSIS — R197 Diarrhea, unspecified: Secondary | ICD-10-CM

## 2018-10-06 MED ORDER — CHANTIX STARTING MONTH PAK 0.5 MG X 11 & 1 MG X 42 PO TABS: ORAL_TABLET | ORAL | 0 refills | Status: DC

## 2018-10-06 MED ORDER — VARENICLINE TARTRATE 1 MG PO TABS: 1.0000 mg | ORAL_TABLET | Freq: Two times a day (BID) | ORAL | 1 refills | Status: DC

## 2018-10-06 NOTE — Progress Notes (Signed)
OFFICE VISIT  10/09/2018   CC:  Chief Complaint  Patient presents with  . Abdominal Pain    ABD has gotten better   . Cough    Started in October, productive    HPI:    Patient is a 48 y.o. Caucasian male who presents for f/u diarrhea, lower abd pain, and chronic cough. I saw him 9 days ago and rx'd tessalon pearls. CXR was normal.  I did some blood tests and ordered stool studies.  Labs all normal except Hb 18.6, remainder of CBC all normal, AST and ALT a little over twice normal levels, remainder of CMET normal. Hep B and C screening negative. Patient IS a smoker.  Interim hx: Diarrhea and lower abd pain resolved right after our visit 9 d/a. Has been taking tessalon, still with cough productive of mucous-->mucous worse in mornings. No blood in mucous.  Occ wheezing, brief.  No SOB. He continues to smoke cigarettes, has had some success in the past with using chantix to quit, wants to try this again.  He does snore.  Wife doesn't comment on apneic spells in sleep.  No excessive daytime sleepiness. Works at [employer]  No environment in which carbon monoxide excess is possible. No known fam hx of polycythemia or myelodysplastic disorder.  ROS: no CP, no SOB, no wheezing, no cough, no dizziness, no HAs, no rashes, no melena/hematochezia.  No polyuria or polydipsia.  No myalgias or arthralgias. No redness of face.  No pain on bottoms of feet or in palms of hands.  No acute vision complaints  Past Medical History:  Diagnosis Date  . Hypertension   . Tobacco dependence     Past Surgical History:  Procedure Laterality Date  . vasectomy    . VASECTOMY REVERSAL      Outpatient Medications Prior to Visit  Medication Sig Dispense Refill  . benzonatate (TESSALON) 200 MG capsule Take 1 capsule (200 mg total) by mouth 2 (two) times daily as needed for cough. 30 capsule 2  . hydrochlorothiazide (HYDRODIURIL) 25 MG tablet Take 1 tablet (25 mg total) by mouth daily. 30 tablet 3  .  lisinopril (PRINIVIL,ZESTRIL) 20 MG tablet Take 1 tablet (20 mg total) by mouth daily. 30 tablet 3  . lisinopril (ZESTRIL) 10 MG tablet Take 10 mg by mouth once. Pt takes 2 tablets daily.    . varenicline (CHANTIX) 0.5 MG tablet Take 1 tablet (0.5 mg total) by mouth 2 (two) times daily. (Patient not taking: Reported on 10/06/2018) 60 tablet 0   No facility-administered medications prior to visit.     No Known Allergies  ROS As per HPI  PE: Blood pressure 124/77, pulse 71, temperature 98.5 F (36.9 C), temperature source Temporal, resp. rate 16, height 5' 6.5" (1.689 m), weight 159 lb 6 oz (72.3 kg), SpO2 98 %. Gen: Alert, well appearing.  Patient is oriented to person, place, time, and situation. AFFECT: pleasant, lucid thought and speech. No further exam today.  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  Asymptomatic. Suspect this is from tobacco abuse with recent superimposed mild dehydration/hemoconcentration.  OSA less likely. Repeat cbc today, do peripheral smear review. Consider ferritin/iron panel. If rising Hb or if staying high despite smoking cessation then will ask hematologist to see.  2) Diarrhea illness: resolved.  3) Elevated transaminases--mild.  Stable on last check. Hep B and C neg.  Fatty liver most likely.  ? Transient and related to recent viral syndrome. Keep hepatic inflammation from hemachromatosis in back  of mind given #1.  4) Chronic bronchitis.  Cough stable. Needs to quit smoking.  5) Tob dep: chantix trial started today.  An After Visit Summary was printed and given to the patient.  FOLLOW UP: Return in about 3 months (around 01/06/2019) for routine chronic illness f/u.  Signed:  Crissie Sickles, MD           10/09/2018

## 2018-10-07 LAB — CBC WITH DIFFERENTIAL/PLATELET
Absolute Monocytes: 957 cells/uL — ABNORMAL HIGH (ref 200–950)
Basophils Absolute: 78 cells/uL (ref 0–200)
Basophils Relative: 0.9 %
Eosinophils Absolute: 252 cells/uL (ref 15–500)
Eosinophils Relative: 2.9 %
HCT: 50.7 % — ABNORMAL HIGH (ref 38.5–50.0)
Hemoglobin: 17.5 g/dL — ABNORMAL HIGH (ref 13.2–17.1)
Lymphs Abs: 2714 cells/uL (ref 850–3900)
MCH: 32.5 pg (ref 27.0–33.0)
MCHC: 34.5 g/dL (ref 32.0–36.0)
MCV: 94.2 fL (ref 80.0–100.0)
MPV: 11.6 fL (ref 7.5–12.5)
Monocytes Relative: 11 %
Neutro Abs: 4698 cells/uL (ref 1500–7800)
Neutrophils Relative %: 54 %
Platelets: 223 10*3/uL (ref 140–400)
RBC: 5.38 10*6/uL (ref 4.20–5.80)
RDW: 13.3 % (ref 11.0–15.0)
Total Lymphocyte: 31.2 %
WBC: 8.7 10*3/uL (ref 3.8–10.8)

## 2018-10-07 LAB — COMPREHENSIVE METABOLIC PANEL
AG Ratio: 1.7 (calc) (ref 1.0–2.5)
ALT: 112 U/L — ABNORMAL HIGH (ref 9–46)
AST: 58 U/L — ABNORMAL HIGH (ref 10–40)
Albumin: 4.7 g/dL (ref 3.6–5.1)
Alkaline phosphatase (APISO): 75 U/L (ref 36–130)
BUN: 18 mg/dL (ref 7–25)
CO2: 23 mmol/L (ref 20–32)
Calcium: 10.4 mg/dL — ABNORMAL HIGH (ref 8.6–10.3)
Chloride: 101 mmol/L (ref 98–110)
Creat: 0.89 mg/dL (ref 0.60–1.35)
Globulin: 2.7 g/dL (calc) (ref 1.9–3.7)
Glucose, Bld: 93 mg/dL (ref 65–99)
Potassium: 4.5 mmol/L (ref 3.5–5.3)
Sodium: 136 mmol/L (ref 135–146)
Total Bilirubin: 0.7 mg/dL (ref 0.2–1.2)
Total Protein: 7.4 g/dL (ref 6.1–8.1)

## 2018-10-11 LAB — GIARDIA/CRYPTOSPORIDIUM (EIA)
MICRO NUMBER:: 794667
MICRO NUMBER:: 794669
RESULT:: NOT DETECTED
RESULT:: NOT DETECTED
SPECIMEN QUALITY:: ADEQUATE
SPECIMEN QUALITY:: ADEQUATE

## 2018-10-11 LAB — STOOL CULTURE
MICRO NUMBER:: 794666
MICRO NUMBER:: 794668
MICRO NUMBER:: 794671
SHIGA RESULT:: NOT DETECTED
SPECIMEN QUALITY:: ADEQUATE
SPECIMEN QUALITY:: ADEQUATE
SPECIMEN QUALITY:: ADEQUATE

## 2018-10-11 LAB — CLOSTRIDIUM DIFFICILE TOXIN B, QUALITATIVE, REAL-TIME PCR: Toxigenic C. Difficile by PCR: NOT DETECTED

## 2018-10-11 LAB — OVA AND PARASITE EXAMINATION
CONCENTRATE RESULT:: NONE SEEN
MICRO NUMBER:: 794670
SPECIMEN QUALITY:: ADEQUATE
TRICHROME RESULT:: NONE SEEN

## 2018-10-12 ENCOUNTER — Other Ambulatory Visit: Payer: Self-pay | Admitting: Family Medicine

## 2018-10-12 DIAGNOSIS — D751 Secondary polycythemia: Secondary | ICD-10-CM

## 2018-10-25 ENCOUNTER — Other Ambulatory Visit: Payer: Self-pay

## 2018-10-25 MED ORDER — HYDROCHLOROTHIAZIDE 25 MG PO TABS: 25.0000 mg | ORAL_TABLET | Freq: Every day | ORAL | 3 refills | Status: DC

## 2018-10-26 ENCOUNTER — Other Ambulatory Visit: Payer: Self-pay

## 2018-10-26 ENCOUNTER — Ambulatory Visit (INDEPENDENT_AMBULATORY_CARE_PROVIDER_SITE_OTHER): Payer: No Typology Code available for payment source | Admitting: Family Medicine

## 2018-10-26 DIAGNOSIS — D751 Secondary polycythemia: Secondary | ICD-10-CM | POA: Diagnosis not present

## 2018-10-27 ENCOUNTER — Other Ambulatory Visit: Payer: Self-pay | Admitting: Family Medicine

## 2018-10-27 ENCOUNTER — Encounter: Payer: 59 | Admitting: Family Medicine

## 2018-10-31 LAB — IRON,TIBC AND FERRITIN PANEL
%SAT: 63 % (calc) — ABNORMAL HIGH (ref 20–48)
Ferritin: 811 ng/mL — ABNORMAL HIGH (ref 38–380)
Iron: 169 ug/dL (ref 50–180)
TIBC: 270 mcg/dL (calc) (ref 250–425)

## 2018-10-31 LAB — CBC WITH DIFFERENTIAL/PLATELET
Absolute Monocytes: 704 cells/uL (ref 200–950)
Basophils Absolute: 61 cells/uL (ref 0–200)
Basophils Relative: 1.2 %
Eosinophils Absolute: 173 cells/uL (ref 15–500)
Eosinophils Relative: 3.4 %
HCT: 48.9 % (ref 38.5–50.0)
Hemoglobin: 17 g/dL (ref 13.2–17.1)
Lymphs Abs: 1974 cells/uL (ref 850–3900)
MCH: 33 pg (ref 27.0–33.0)
MCHC: 34.8 g/dL (ref 32.0–36.0)
MCV: 95 fL (ref 80.0–100.0)
MPV: 11.8 fL (ref 7.5–12.5)
Monocytes Relative: 13.8 %
Neutro Abs: 2188 cells/uL (ref 1500–7800)
Neutrophils Relative %: 42.9 %
Platelets: 176 10*3/uL (ref 140–400)
RBC: 5.15 10*6/uL (ref 4.20–5.80)
RDW: 12.8 % (ref 11.0–15.0)
Total Lymphocyte: 38.7 %
WBC: 5.1 10*3/uL (ref 3.8–10.8)

## 2018-10-31 LAB — HEPATIC FUNCTION PANEL
AG Ratio: 1.5 (calc) (ref 1.0–2.5)
ALT: 118 U/L — ABNORMAL HIGH (ref 9–46)
AST: 66 U/L — ABNORMAL HIGH (ref 10–40)
Albumin: 4.2 g/dL (ref 3.6–5.1)
Alkaline phosphatase (APISO): 73 U/L (ref 36–130)
Bilirubin, Direct: 0.2 mg/dL (ref 0.0–0.2)
Globulin: 2.8 g/dL (calc) (ref 1.9–3.7)
Indirect Bilirubin: 0.8 mg/dL (calc) (ref 0.2–1.2)
Total Bilirubin: 1 mg/dL (ref 0.2–1.2)
Total Protein: 7 g/dL (ref 6.1–8.1)

## 2018-10-31 LAB — PATHOLOGIST SMEAR REVIEW

## 2018-10-31 LAB — TEST AUTHORIZATION

## 2018-10-31 LAB — HEMOCHROMATOSIS DNA-PCR(C282Y,H63D)

## 2018-10-31 LAB — ERYTHROPOIETIN: Erythropoietin: 4.8 m[IU]/mL (ref 2.6–18.5)

## 2018-11-01 ENCOUNTER — Encounter: Payer: Self-pay | Admitting: Family Medicine

## 2018-11-02 ENCOUNTER — Other Ambulatory Visit: Payer: Self-pay

## 2018-11-02 DIAGNOSIS — R7989 Other specified abnormal findings of blood chemistry: Secondary | ICD-10-CM

## 2018-11-02 DIAGNOSIS — D751 Secondary polycythemia: Secondary | ICD-10-CM

## 2018-11-02 NOTE — Progress Notes (Signed)
am

## 2018-11-03 ENCOUNTER — Telehealth: Payer: Self-pay | Admitting: Hematology & Oncology

## 2018-11-03 NOTE — Telephone Encounter (Signed)
Spoke with patient to confirm new patient appt date/time/location on 10/8 at 130 pm

## 2018-11-07 ENCOUNTER — Other Ambulatory Visit: Payer: Self-pay | Admitting: Family Medicine

## 2018-11-22 ENCOUNTER — Other Ambulatory Visit: Payer: Self-pay | Admitting: Family

## 2018-11-22 DIAGNOSIS — D582 Other hemoglobinopathies: Secondary | ICD-10-CM

## 2018-11-22 DIAGNOSIS — R7989 Other specified abnormal findings of blood chemistry: Secondary | ICD-10-CM

## 2018-11-23 ENCOUNTER — Inpatient Hospital Stay: Payer: 59 | Attending: Hematology & Oncology | Admitting: Family

## 2018-11-23 ENCOUNTER — Encounter: Payer: Self-pay | Admitting: Family

## 2018-11-23 ENCOUNTER — Inpatient Hospital Stay: Payer: 59

## 2018-11-23 ENCOUNTER — Other Ambulatory Visit: Payer: Self-pay

## 2018-11-23 VITALS — BP 111/65 | HR 82 | Temp 98.7°F | Resp 18 | Ht 66.0 in | Wt 159.0 lb

## 2018-11-23 DIAGNOSIS — D582 Other hemoglobinopathies: Secondary | ICD-10-CM | POA: Diagnosis not present

## 2018-11-23 DIAGNOSIS — D751 Secondary polycythemia: Secondary | ICD-10-CM | POA: Insufficient documentation

## 2018-11-23 DIAGNOSIS — Z79899 Other long term (current) drug therapy: Secondary | ICD-10-CM | POA: Diagnosis not present

## 2018-11-23 DIAGNOSIS — F1010 Alcohol abuse, uncomplicated: Secondary | ICD-10-CM | POA: Insufficient documentation

## 2018-11-23 DIAGNOSIS — F17210 Nicotine dependence, cigarettes, uncomplicated: Secondary | ICD-10-CM | POA: Diagnosis not present

## 2018-11-23 DIAGNOSIS — R718 Other abnormality of red blood cells: Secondary | ICD-10-CM

## 2018-11-23 DIAGNOSIS — R7989 Other specified abnormal findings of blood chemistry: Secondary | ICD-10-CM | POA: Diagnosis not present

## 2018-11-23 LAB — CMP (CANCER CENTER ONLY)
ALT: 44 U/L (ref 0–44)
AST: 26 U/L (ref 15–41)
Albumin: 4.5 g/dL (ref 3.5–5.0)
Alkaline Phosphatase: 68 U/L (ref 38–126)
Anion gap: 9 (ref 5–15)
BUN: 17 mg/dL (ref 6–20)
CO2: 26 mmol/L (ref 22–32)
Calcium: 9.6 mg/dL (ref 8.9–10.3)
Chloride: 101 mmol/L (ref 98–111)
Creatinine: 1.07 mg/dL (ref 0.61–1.24)
GFR, Est AFR Am: >60 mL/min (ref >60)
GFR, Estimated: >60 mL/min (ref >60)
Glucose, Bld: 129 mg/dL — ABNORMAL HIGH (ref 70–99)
Potassium: 3.8 mmol/L (ref 3.5–5.1)
Sodium: 136 mmol/L (ref 135–145)
Total Bilirubin: 0.7 mg/dL (ref 0.3–1.2)
Total Protein: 7.2 g/dL (ref 6.5–8.1)

## 2018-11-23 LAB — CBC WITH DIFFERENTIAL (CANCER CENTER ONLY)
Abs Immature Granulocytes: 0.08 10*3/uL — ABNORMAL HIGH (ref 0.00–0.07)
Basophils Absolute: 0.1 10*3/uL (ref 0.0–0.1)
Basophils Relative: 1 %
Eosinophils Absolute: 0.3 10*3/uL (ref 0.0–0.5)
Eosinophils Relative: 3 %
HCT: 46.2 % (ref 39.0–52.0)
Hemoglobin: 16.3 g/dL (ref 13.0–17.0)
Immature Granulocytes: 1 %
Lymphocytes Relative: 33 %
Lymphs Abs: 2.8 10*3/uL (ref 0.7–4.0)
MCH: 32.5 pg (ref 26.0–34.0)
MCHC: 35.3 g/dL (ref 30.0–36.0)
MCV: 92.2 fL (ref 80.0–100.0)
Monocytes Absolute: 0.8 10*3/uL (ref 0.1–1.0)
Monocytes Relative: 9 %
Neutro Abs: 4.4 10*3/uL (ref 1.7–7.7)
Neutrophils Relative %: 53 %
Platelet Count: 228 10*3/uL (ref 150–400)
RBC: 5.01 MIL/uL (ref 4.22–5.81)
RDW: 13.2 % (ref 11.5–15.5)
WBC Count: 8.3 10*3/uL (ref 4.0–10.5)
nRBC: 0 % (ref 0.0–0.2)

## 2018-11-23 LAB — RETICULOCYTES
Immature Retic Fract: 11.1 % (ref 2.3–15.9)
RBC.: 5 MIL/uL (ref 4.22–5.81)
Retic Count, Absolute: 104 10*3/uL (ref 19.0–186.0)
Retic Ct Pct: 2.1 % (ref 0.4–3.1)

## 2018-11-23 LAB — VITAMIN B12: Vitamin B-12: 429 pg/mL (ref 180–914)

## 2018-11-23 NOTE — Progress Notes (Signed)
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  Today's Hgb is down to 16.3 from 18.6. He states that he has started Chantix and is down to 5 cigarettes per week from 1 ppd.  He also was drinking 2 fifths of whiskey per week until a 2 months ago when he started having frequent stools and lower abdominal pain. The pain and frequent stools stopped after 2 weeks.  He has not had a colonoscopy.  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  He will sometimes have dizziness if he stands too quickly.  He has occasional mild SOB with over exertion.  No testosterone or work out supplement usage.  No personal history of stroke or thrombus.  No personal history of cancer. Family history includes: paternal grandmother - pancreatic, maternal grandfather - liver and colon and mother - peritoneal.  His maternal grandmother had history of stroke.  No fever, chills, n/v, cough, SOB, chest pain, palpitations, abdominal pain or changes in bowel or bladder habits at this time.  No swelling, tenderness, numbness or tingling in his extremities.  His appetite comes and goes. He is hydrating well with sweet tea. He does not drink water. He states that his weight is stable.  He works full time for [employer]   ROS: All other 10 point review of systems is negative.   PAST MEDICAL HISTORY:   Past Medical History:  Diagnosis Date  . Hypertension   . Polycythemia    genetic hemochromatosis test neg.  +Elevated ferritin and %sat.  + smoker.  Not on testost and no s/s of OSA.   Recommended ref to [facility] 10/2018.  . Tobacco dependence     ALLERGIES: No Known Allergies    MEDICATIONS:  Current Outpatient Medications on File Prior to Visit  Medication Sig Dispense Refill  . hydrochlorothiazide (HYDRODIURIL) 25 MG tablet Take 1 tablet (25 mg total) by mouth daily. 30 tablet 3  . lisinopril (ZESTRIL) 10 MG tablet Take 10 mg by mouth once. Pt takes 2 tablets daily.    . varenicline (CHANTIX) 1 MG tablet Take 1 mg by mouth 2 (two) times daily.    . benzonatate (TESSALON) 200 MG capsule Take 1 capsule (200 mg total) by mouth 2 (two) times daily as needed for cough. (Patient not taking: Reported on 11/23/2018) 30 capsule 2   No current facility-administered medications on file prior to visit.      PAST SURGICAL HISTORY Past Surgical History:  Procedure Laterality Date  . vasectomy    . VASECTOMY REVERSAL      FAMILY HISTORY: Family History  Problem Relation Age of Onset  . Cancer Mother        Peritoneal Cancer  . Hypertension Father   . Stroke Father   . Asthma Sister   . Alcohol abuse Brother   . Depression Brother   . Drug abuse Brother   . Early death Brother   . Heart attack Maternal Grandmother   . Stroke Maternal Grandmother   . Stroke Maternal Grandfather   . Heart disease  Maternal Grandfather   . Asthma Daughter     SOCIAL HISTORY:  reports that he has been smoking cigarettes. He has a 10.00 pack-year smoking history. He has never used smokeless tobacco. He reports current alcohol use of about 14.0 standard drinks of alcohol per week. He reports that he does not use drugs.  PERFORMANCE STATUS: The patient's performance status is 1 - Symptomatic but completely ambulatory  PHYSICAL EXAM: Most Recent Vital Signs: Blood pressure 111/65, pulse 82, temperature 98.7 F (37.1 C), temperature source Temporal, resp. rate 18, height 5\' 6"  (1.676 m), weight 159 lb (72.1 kg), SpO2 100 %. BP 111/65 (BP Location: Left Arm, Patient Position: Sitting)    Pulse 82   Temp 98.7 F (37.1 C) (Temporal)   Resp 18   Ht 5\' 6"  (1.676 m)   Wt 159 lb (72.1 kg)   SpO2 100%   BMI 25.66 kg/m   General Appearance:    Alert, cooperative, no distress, appears stated age  Head:    Normocephalic, without obvious abnormality, atraumatic  Eyes:    PERRL, conjunctiva/corneas clear, EOM's intact, fundi    benign, both eyes             Throat:   Lips, mucosa, and tongue normal; teeth and gums normal  Neck:   Supple, symmetrical, trachea midline, no adenopathy;       thyroid:  No enlargement/tenderness/nodules; no carotid   bruit or JVD  Back:     Symmetric, no curvature, ROM normal, no CVA tenderness  Lungs:     Clear to auscultation bilaterally, respirations unlabored  Chest wall:    No tenderness or deformity  Heart:    Regular rate and rhythm, S1 and S2 normal, no murmur, rub   or gallop  Abdomen:     Soft, non-tender, bowel sounds active all four quadrants,    no masses, no organomegaly        Extremities:   Extremities normal, atraumatic, no cyanosis or edema  Pulses:   2+ and symmetric all extremities  Skin:   Skin color, texture, turgor normal, no rashes or lesions  Lymph nodes:   Cervical, supraclavicular, and axillary nodes normal  Neurologic:   CNII-XII intact. Normal strength, sensation and reflexes      throughout    LABORATORY DATA:  Results for orders placed or performed in visit on 11/23/18 (from the past 48 hour(s))  CBC with Differential ([place] Only)     Status: Abnormal   Collection Time: 11/23/18  1:39 PM  Result Value Ref Range   WBC Count 8.3 4.0 - 10.5 K/uL   RBC 5.01 4.22 - 5.81 MIL/uL   Hemoglobin 16.3 13.0 - 17.0 g/dL   HCT 46.2 39.0 - 52.0 %   MCV 92.2 80.0 - 100.0 fL   MCH 32.5 26.0 - 34.0 pg   MCHC 35.3 30.0 - 36.0 g/dL   RDW 13.2 11.5 - 15.5 %   Platelet Count 228 150 - 400 K/uL   nRBC 0.0 0.0 - 0.2 %   Neutrophils Relative % 53 %   Neutro Abs 4.4 1.7 - 7.7 K/uL   Lymphocytes Relative 33 %   Lymphs  Abs 2.8 0.7 - 4.0 K/uL   Monocytes Relative 9 %   Monocytes Absolute 0.8 0.1 - 1.0 K/uL   Eosinophils Relative 3 %   Eosinophils Absolute 0.3 0.0 - 0.5 K/uL   Basophils Relative 1 %   Basophils Absolute 0.1 0.0 - 0.1 K/uL   Immature  Granulocytes 1 %   Abs Immature Granulocytes 0.08 (H) 0.00 - 0.07 K/uL    Comment: Performed at [facility] at [facility], [address], [place], [place] [address]  Reticulocytes     Status: None   Collection Time: 11/23/18  1:39 PM  Result Value Ref Range   Retic Ct Pct 2.1 0.4 - 3.1 %   RBC. 5.00 4.22 - 5.81 MIL/uL   Retic Count, Absolute 104.0 19.0 - 186.0 K/uL   Immature Retic Fract 11.1 2.3 - 15.9 %    Comment: Performed at [facility] at [facility], [address] [place], [place] [address]  CMP ([place] only)     Status: Abnormal   Collection Time: 11/23/18  1:39 PM  Result Value Ref Range   Sodium 136 135 - 145 mmol/L   Potassium 3.8 3.5 - 5.1 mmol/L   Chloride 101 98 - 111 mmol/L   CO2 26 22 - 32 mmol/L   Glucose, Bld 129 (H) 70 - 99 mg/dL   BUN 17 6 - 20 mg/dL   Creatinine 1.07 0.61 - 1.24 mg/dL   Calcium 9.6 8.9 - 10.3 mg/dL   Total Protein 7.2 6.5 - 8.1 g/dL   Albumin 4.5 3.5 - 5.0 g/dL   AST 26 15 - 41 U/L   ALT 44 0 - 44 U/L   Alkaline Phosphatase 68 38 - 126 U/L   Total Bilirubin 0.7 0.3 - 1.2 mg/dL   GFR, Est Non Af Am >60 >60 mL/min   GFR, Est AFR Am >60 >60 mL/min   Anion gap 9 5 - 15    Comment: Performed at [facility] at [facility], [address], [place], [place] [address]      RADIOGRAPHY: No results found.     PATHOLOGY: None  ASSESSMENT/PLAN: Mr. 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  He is cutting back on his alcohol intake and smoking which seems to have helped with the red blood cell count. We discussed how this  effects alcohol has on the liver and increased iron absorption as well as how smoking can increase the Hgb. Her verbalized understanding and will continue to cut back.  Results of today's lab work are pending.  We will also get an US of the abdomen to evaluate the liver and spleen.  Once we have all the results we will schedule a follow-up.   All questions were answered and he is in agreement with the plan. He will contact our office with any questions or concerns. We can certainly see him sooner if needed.  He was discussed with and also seen by Dr. Marin Olp and he is in agreement with the aforementioned.   Laverna Peace, NP    Addendum: I saw and examined Mr. Vanskiver with Judson Roch.  I agree with the above assessment.  He is already had test for the hemochromatosis gene.  This was negative.  His iron studies we saw him with showed a ferritin of 417 with an iron saturation of only 35%.  I suspect that the ferritin elevation is more reactive.  I note that his erythropoietin level was only 4.8.  I must say that this is on the low side.  I certainly think that cutting back on alcohol and smoking will certainly help this situation.  I do not see that he needs have a bone marrow test done.  We will see what ultrasound of the abdomen looks like.  Again, I think alcohol use is certainly a major factor with respect to his hematologic abnormalities.  Now that he is cut back, his labs do look better.  We we spent about 45 minutes with Mr. Tiongco.  He is a nice guy.  He was fun to talk to.  We went over his lab work.  We again emphasized that he really needs to stop the alcohol use and also tobacco use.  I would expect that this would happen all of a sudden but hopefully gradually.  I think we can probably get him back in another few months.  We will see how his labs look at that time.  Lattie Haw, MD

## 2018-11-24 ENCOUNTER — Telehealth: Payer: Self-pay | Admitting: Family

## 2018-11-24 LAB — LACTATE DEHYDROGENASE: LDH: 183 U/L (ref 98–192)

## 2018-11-24 LAB — IRON AND TIBC
Iron: 103 ug/dL (ref 42–163)
Saturation Ratios: 35 % (ref 20–55)
TIBC: 296 ug/dL (ref 202–409)
UIBC: 193 ug/dL (ref 117–376)

## 2018-11-24 LAB — FERRITIN: Ferritin: 417 ng/mL — ABNORMAL HIGH (ref 24–336)

## 2018-11-24 NOTE — Telephone Encounter (Signed)
Needs [place] in the next week please. Thank you! Will schedule follow-up once we have lab and [place] results. Per 10/8 los

## 2018-12-11 LAB — JAK 2 V617F (GENPATH)

## 2018-12-13 ENCOUNTER — Other Ambulatory Visit: Payer: Self-pay

## 2018-12-13 ENCOUNTER — Ambulatory Visit (HOSPITAL_BASED_OUTPATIENT_CLINIC_OR_DEPARTMENT_OTHER)
Admission: RE | Admit: 2018-12-13 | Discharge: 2018-12-13 | Disposition: A | Discharge status: Home / Self Care | Payer: 59 | Source: Ambulatory Visit | Attending: Family | Admitting: Family

## 2018-12-13 DIAGNOSIS — D582 Other hemoglobinopathies: Secondary | ICD-10-CM | POA: Diagnosis present

## 2018-12-13 DIAGNOSIS — R7989 Other specified abnormal findings of blood chemistry: Secondary | ICD-10-CM

## 2018-12-13 DIAGNOSIS — R718 Other abnormality of red blood cells: Secondary | ICD-10-CM | POA: Diagnosis present

## 2018-12-14 ENCOUNTER — Other Ambulatory Visit: Payer: Self-pay | Admitting: Family Medicine

## 2018-12-15 ENCOUNTER — Telehealth: Payer: Self-pay | Admitting: *Deleted

## 2018-12-15 ENCOUNTER — Encounter: Payer: Self-pay | Admitting: Family Medicine

## 2018-12-15 NOTE — Telephone Encounter (Signed)
Unable to reach pt. LMOVM ,notified pt of results. Encouraged pt to call with concerns.

## 2019-01-02 ENCOUNTER — Other Ambulatory Visit: Payer: Self-pay

## 2019-01-03 ENCOUNTER — Encounter: Payer: Self-pay | Admitting: Family Medicine

## 2019-01-03 ENCOUNTER — Ambulatory Visit (INDEPENDENT_AMBULATORY_CARE_PROVIDER_SITE_OTHER): Payer: No Typology Code available for payment source | Admitting: Family Medicine

## 2019-01-03 VITALS — BP 114/74 | HR 72 | Temp 98.6°F | Resp 16 | Ht 66.0 in | Wt 158.2 lb

## 2019-01-03 DIAGNOSIS — D751 Secondary polycythemia: Secondary | ICD-10-CM | POA: Diagnosis not present

## 2019-01-03 DIAGNOSIS — Z23 Encounter for immunization: Secondary | ICD-10-CM

## 2019-01-03 DIAGNOSIS — Z1211 Encounter for screening for malignant neoplasm of colon: Secondary | ICD-10-CM | POA: Diagnosis not present

## 2019-01-03 DIAGNOSIS — I10 Essential (primary) hypertension: Secondary | ICD-10-CM | POA: Diagnosis not present

## 2019-01-03 DIAGNOSIS — R7301 Impaired fasting glucose: Secondary | ICD-10-CM

## 2019-01-03 DIAGNOSIS — F17200 Nicotine dependence, unspecified, uncomplicated: Secondary | ICD-10-CM

## 2019-01-03 MED ORDER — VARENICLINE TARTRATE 1 MG PO TABS: 1.0000 mg | ORAL_TABLET | Freq: Two times a day (BID) | ORAL | 1 refills | Status: DC

## 2019-01-03 NOTE — Patient Instructions (Signed)

## 2019-01-03 NOTE — Progress Notes (Signed)
Office Note 01/03/2019  CC:  Chief Complaint  Patient presents with  . Follow-up    RCI, pt is not fasting    HPI:  Calvin Gomez is a 48 y.o. White male who is here for annual health maintenance exam.  Has been on maintenance dose chantix.  He is still smoking but is way down: approx 4 cigs per day. Alcohol intake is down, only drinking on weekends, mostly football Sunday.  Past Medical History:  Diagnosis Date  . Hypertension   . Polycythemia    genetic hemochromatosis test neg.  +Elevated ferritin and %sat.  + smoker.  Not on testost and no s/s of OSA.  Hematol eval 11/2018->reactive changes from alc abuse + tobacco suspected as cause, abd u/s normal.  . Tobacco dependence     Past Surgical History:  Procedure Laterality Date  . vasectomy    . VASECTOMY REVERSAL      Family History  Problem Relation Age of Onset  . Cancer Mother        Peritoneal Cancer  . Hypertension Father   . Stroke Father   . Asthma Sister   . Alcohol abuse Brother   . Depression Brother   . Drug abuse Brother   . Early death Brother   . Heart attack Maternal Grandmother   . Stroke Maternal Grandmother   . Stroke Maternal Grandfather   . Heart disease Maternal Grandfather   . Asthma Daughter     Social History   Socioeconomic History  . Marital status: Married    Spouse name: Not on file  . Number of children: Not on file  . Years of education: Not on file  . Highest education level: Not on file  Occupational History  . Not on file  Social Needs  . Financial resource strain: Not on file  . Food insecurity    Worry: Not on file    Inability: Not on file  . Transportation needs    Medical: Not on file    Non-medical: Not on file  Tobacco Use  . Smoking status: Current Every Day Smoker    Packs/day: 0.50    Years: 20.00    Pack years: 10.00    Types: Cigarettes  . Smokeless tobacco: Never Used  Substance and Sexual Activity  . Alcohol use: Yes    Alcohol/week: 14.0  standard drinks    Types: 14 Shots of liquor per week  . Drug use: Never  . Sexual activity: Not on file  Lifestyle  . Physical activity    Days per week: Not on file    Minutes per session: Not on file  . Stress: Not on file  Relationships  . Social Herbalist on phone: Not on file    Gets together: Not on file    Attends religious service: Not on file    Active member of club or organization: Not on file    Attends meetings of clubs or organizations: Not on file    Relationship status: Not on file  . Intimate partner violence    Fear of current or ex partner: Not on file    Emotionally abused: Not on file    Physically abused: Not on file    Forced sexual activity: Not on file  Other Topics Concern  . Not on file  Social History Narrative   Married, 4 girls, 1 boy.   Educ: some college   Occup: sales with [employer]   Tob: 20  pack-yr hx, current as of 02/2018.   Alc: about 2 liquor drinks per night.   No drugs.    Outpatient Medications Prior to Visit  Medication Sig Dispense Refill  . hydrochlorothiazide (HYDRODIURIL) 25 MG tablet Take 1 tablet (25 mg total) by mouth daily. 30 tablet 3  . lisinopril (ZESTRIL) 10 MG tablet Take 10 mg by mouth once. Pt takes 2 tablets daily.    . varenicline (CHANTIX) 1 MG tablet Take 1 mg by mouth 2 (two) times daily.    . benzonatate (TESSALON) 200 MG capsule Take 1 capsule (200 mg total) by mouth 2 (two) times daily as needed for cough. (Patient not taking: Reported on 11/23/2018) 30 capsule 2   No facility-administered medications prior to visit.     No Known Allergies  ROS Review of Systems  Constitutional: Negative for appetite change, chills, fatigue and fever.  HENT: Negative for congestion, dental problem, ear pain and sore throat.   Eyes: Negative for discharge, redness and visual disturbance.  Respiratory: Negative for cough, chest tightness, shortness of breath and wheezing.   Cardiovascular: Negative for chest  pain, palpitations and leg swelling.  Gastrointestinal: Negative for abdominal pain, blood in stool, diarrhea, nausea and vomiting.  Genitourinary: Negative for difficulty urinating, dysuria, flank pain, frequency, hematuria and urgency.  Musculoskeletal: Negative for arthralgias, back pain, joint swelling, myalgias and neck stiffness.  Skin: Negative for pallor and rash.  Neurological: Negative for dizziness, speech difficulty, weakness and headaches.  Hematological: Negative for adenopathy. Does not bruise/bleed easily.  Psychiatric/Behavioral: Negative for confusion and sleep disturbance. The patient is not nervous/anxious.     PE; Blood pressure 114/74, pulse 72, temperature 98.6 F (37 C), temperature source Temporal, resp. rate 16, height 5\' 6"  (1.676 m), weight 158 lb 3.2 oz (71.8 kg), SpO2 93 %. Body mass index is 25.53 kg/m.  Gen: Alert, well appearing.  Patient is oriented to person, place, time, and situation. AFFECT: pleasant, lucid thought and speech. ENT: Ears: EACs clear, normal epithelium.  TMs with good light reflex and landmarks bilaterally.  Eyes: no injection, icteris, swelling, or exudate.  EOMI, PERRLA. Nose: no drainage or turbinate edema/swelling.  No injection or focal lesion.  Mouth: lips without lesion/swelling.  Oral mucosa pink and moist.  Dentition intact and without obvious caries or gingival swelling.  Oropharynx without erythema, exudate, or swelling.  Neck: supple/nontender.  No LAD, mass, or TM.  Carotid pulses 2+ bilaterally, without bruits. CV: RRR, no m/r/g.   LUNGS: CTA bilat, nonlabored resps, good aeration in all lung fields. ABD: soft, NT, ND, BS normal.  No hepatospenomegaly or mass.  No bruits. EXT: no clubbing, cyanosis, or edema.  Musculoskeletal: no joint swelling, erythema, warmth, or tenderness.  ROM of all joints intact. Skin - no sores or suspicious lesions or rashes or color changes   Pertinent labs:  Lab Results  Component Value Date    TSH 1.80 01/20/2018   Lab Results  Component Value Date   WBC 8.3 11/23/2018   HGB 16.3 11/23/2018   HCT 46.2 11/23/2018   MCV 92.2 11/23/2018   PLT 228 11/23/2018   Lab Results  Component Value Date   CREATININE 1.07 11/23/2018   BUN 17 11/23/2018   NA 136 11/23/2018   K 3.8 11/23/2018   CL 101 11/23/2018   CO2 26 11/23/2018  Glucose 129 11/23/18  Lab Results  Component Value Date   VITAMINB12 429 11/23/2018    Lab Results  Component Value Date   ALT  44 11/23/2018   AST 26 11/23/2018   ALKPHOS 68 11/23/2018   BILITOT 0.7 11/23/2018   No results found for: CHOL No results found for: HDL No results found for: LDLCALC No results found for: TRIG No results found for: CHOLHDL No results found for: PSA  No results found for: HGBA1C  ASSESSMENT AND PLAN:   Health maintenance exam: Reviewed age and gender appropriate health maintenance issues (prudent diet, regular exercise, health risks of tobacco and excessive alcohol, use of seatbelts, fire alarms in home, use of sunscreen).  Also reviewed age and gender appropriate health screening as well as vaccine recommendations. Vaccines: Flu UTD.  Tdap->given today. Labs: recent CBC w/diff stable, CMET normal except glucose 129->this was 6 hours after eating. Will repeat and check FLP, check a1c, glucose, and TSH at a fasting lab visit at his earliest convenience. Prostate ca screening: average risk patient= as per latest guidelines, start screening at 70 yrs of age. Colon ca screening: initial colonoscopy due as per recent guidelines--referred pt to GI today.  Tobacco dependence: chantix helping.  RF'd chantix #60, rF x 1.  An After Visit Summary was printed and given to the patient.  FOLLOW UP:  Return in about 6 months (around 07/03/2019) for routine chronic illness f/u.  Signed:  Crissie Sickles, MD           01/03/2019

## 2019-01-03 NOTE — Addendum Note (Signed)
Addended by: Deveron Furlong D on: 01/03/2019 04:10 PM   Modules accepted: Orders

## 2019-01-09 ENCOUNTER — Other Ambulatory Visit: Payer: Self-pay

## 2019-01-09 ENCOUNTER — Ambulatory Visit (INDEPENDENT_AMBULATORY_CARE_PROVIDER_SITE_OTHER): Payer: No Typology Code available for payment source | Admitting: Family Medicine

## 2019-01-09 DIAGNOSIS — R7301 Impaired fasting glucose: Secondary | ICD-10-CM | POA: Diagnosis not present

## 2019-01-09 DIAGNOSIS — I10 Essential (primary) hypertension: Secondary | ICD-10-CM

## 2019-01-10 ENCOUNTER — Other Ambulatory Visit: Payer: Self-pay

## 2019-01-10 DIAGNOSIS — E7800 Pure hypercholesterolemia, unspecified: Secondary | ICD-10-CM

## 2019-01-10 LAB — TSH: TSH: 3.09 mIU/L (ref 0.40–4.50)

## 2019-01-10 LAB — LIPID PANEL
Cholesterol: 237 mg/dL — ABNORMAL HIGH (ref <200)
HDL: 42 mg/dL (ref >=40)
LDL Cholesterol (Calc): 155 mg/dL (calc) — ABNORMAL HIGH
Non-HDL Cholesterol (Calc): 195 mg/dL (calc) — ABNORMAL HIGH (ref <130)
Total CHOL/HDL Ratio: 5.6 (calc) — ABNORMAL HIGH (ref <5.0)
Triglycerides: 234 mg/dL — ABNORMAL HIGH (ref <150)

## 2019-01-10 LAB — GLUCOSE, RANDOM: Glucose, Bld: 93 mg/dL (ref 65–99)

## 2019-01-10 LAB — HEMOGLOBIN A1C
Hgb A1c MFr Bld: 5.1 % of total Hgb (ref <5.7)
Mean Plasma Glucose: 100 (calc)
eAG (mmol/L): 5.5 (calc)

## 2019-01-10 LAB — HEMOCHROMATOSIS DNA-PCR(C282Y,H63D)

## 2019-01-22 ENCOUNTER — Other Ambulatory Visit: Payer: Self-pay | Admitting: Family Medicine

## 2019-01-22 NOTE — Telephone Encounter (Signed)
RF request for Lisinopril  LOV: 01/03/19 Next ov: 07/04/19 Last written: n/a  Medication never prescribed by you. Please advise if okay for fill, thanks. Medication pending

## 2019-02-05 ENCOUNTER — Other Ambulatory Visit: Payer: Self-pay | Admitting: Family

## 2019-02-05 DIAGNOSIS — R7989 Other specified abnormal findings of blood chemistry: Secondary | ICD-10-CM

## 2019-02-05 DIAGNOSIS — D751 Secondary polycythemia: Secondary | ICD-10-CM

## 2019-02-05 DIAGNOSIS — D582 Other hemoglobinopathies: Secondary | ICD-10-CM

## 2019-02-06 ENCOUNTER — Ambulatory Visit: Payer: 59 | Admitting: Family

## 2019-02-06 ENCOUNTER — Other Ambulatory Visit: Payer: 59

## 2019-02-15 ENCOUNTER — Encounter: Payer: Self-pay | Admitting: Family Medicine

## 2019-02-22 ENCOUNTER — Other Ambulatory Visit: Payer: Self-pay | Admitting: Family Medicine

## 2019-03-14 ENCOUNTER — Other Ambulatory Visit: Payer: Self-pay

## 2019-03-14 ENCOUNTER — Ambulatory Visit (INDEPENDENT_AMBULATORY_CARE_PROVIDER_SITE_OTHER): Payer: No Typology Code available for payment source

## 2019-03-14 ENCOUNTER — Encounter: Payer: Self-pay | Admitting: Family Medicine

## 2019-03-14 DIAGNOSIS — E7800 Pure hypercholesterolemia, unspecified: Secondary | ICD-10-CM

## 2019-03-14 LAB — LIPID PANEL
Cholesterol: 237 mg/dL — ABNORMAL HIGH (ref 0–200)
HDL: 38.7 mg/dL — ABNORMAL LOW (ref >39.00)
NonHDL: 197.91
Total CHOL/HDL Ratio: 6
Triglycerides: 254 mg/dL — ABNORMAL HIGH (ref 0.0–149.0)
VLDL: 50.8 mg/dL — ABNORMAL HIGH (ref 0.0–40.0)

## 2019-03-14 LAB — LDL CHOLESTEROL, DIRECT: Direct LDL: 160 mg/dL

## 2019-03-15 ENCOUNTER — Other Ambulatory Visit: Payer: Self-pay

## 2019-03-15 DIAGNOSIS — E7800 Pure hypercholesterolemia, unspecified: Secondary | ICD-10-CM

## 2019-03-15 MED ORDER — ATORVASTATIN CALCIUM 20 MG PO TABS: 20.0000 mg | ORAL_TABLET | Freq: Every day | ORAL | 2 refills | Status: DC

## 2019-04-06 ENCOUNTER — Other Ambulatory Visit: Payer: Self-pay | Admitting: Family Medicine

## 2019-05-01 ENCOUNTER — Encounter: Payer: Self-pay | Admitting: Family Medicine

## 2019-05-06 ENCOUNTER — Other Ambulatory Visit: Payer: Self-pay | Admitting: Family Medicine

## 2019-06-07 ENCOUNTER — Other Ambulatory Visit: Payer: Self-pay | Admitting: Family Medicine

## 2019-06-12 ENCOUNTER — Other Ambulatory Visit: Payer: Self-pay | Admitting: Family Medicine

## 2019-06-13 ENCOUNTER — Ambulatory Visit: Payer: 59 | Admitting: Family Medicine

## 2019-06-13 ENCOUNTER — Other Ambulatory Visit: Payer: Self-pay

## 2019-06-13 NOTE — Telephone Encounter (Signed)
Patient has lab appt tomorrow.

## 2019-06-14 ENCOUNTER — Encounter: Payer: Self-pay | Admitting: Family Medicine

## 2019-06-14 ENCOUNTER — Ambulatory Visit (INDEPENDENT_AMBULATORY_CARE_PROVIDER_SITE_OTHER): Payer: 59 | Admitting: Family Medicine

## 2019-06-14 DIAGNOSIS — E7800 Pure hypercholesterolemia, unspecified: Secondary | ICD-10-CM | POA: Diagnosis not present

## 2019-06-14 LAB — LIPID PANEL
Cholesterol: 177 mg/dL (ref 0–200)
HDL: 44.1 mg/dL (ref >39.00)
LDL Cholesterol: 111 mg/dL — ABNORMAL HIGH (ref 0–99)
NonHDL: 133.3
Total CHOL/HDL Ratio: 4
Triglycerides: 112 mg/dL (ref 0.0–149.0)
VLDL: 22.4 mg/dL (ref 0.0–40.0)

## 2019-06-28 ENCOUNTER — Other Ambulatory Visit: Payer: Self-pay | Admitting: Family Medicine

## 2019-07-04 ENCOUNTER — Ambulatory Visit: Payer: No Typology Code available for payment source | Admitting: Family Medicine

## 2019-07-07 ENCOUNTER — Other Ambulatory Visit: Payer: Self-pay | Admitting: Family Medicine

## 2019-07-09 NOTE — Telephone Encounter (Signed)
OK, 1 mo supply eRx'd

## 2019-07-09 NOTE — Telephone Encounter (Signed)
RF request for Chantix LOV:01/03/19 Next ov: advised to f/u 6 mo.  Last written: 06/08/19(56,0)  Medication pending, please advise.

## 2019-07-10 NOTE — Telephone Encounter (Signed)
Patient notified. He has also rescheduled 6 month f/u

## 2019-07-21 ENCOUNTER — Other Ambulatory Visit: Payer: Self-pay

## 2019-07-25 ENCOUNTER — Other Ambulatory Visit: Payer: Self-pay

## 2019-07-25 ENCOUNTER — Ambulatory Visit: Payer: No Typology Code available for payment source | Admitting: Family Medicine

## 2019-07-25 ENCOUNTER — Encounter: Payer: Self-pay | Admitting: Family Medicine

## 2019-07-25 VITALS — BP 110/70 | HR 78 | Temp 98.2°F | Resp 16 | Ht 60.0 in | Wt 157.2 lb

## 2019-07-25 DIAGNOSIS — I10 Essential (primary) hypertension: Secondary | ICD-10-CM | POA: Diagnosis not present

## 2019-07-25 DIAGNOSIS — F17200 Nicotine dependence, unspecified, uncomplicated: Secondary | ICD-10-CM | POA: Diagnosis not present

## 2019-07-25 DIAGNOSIS — E7800 Pure hypercholesterolemia, unspecified: Secondary | ICD-10-CM | POA: Diagnosis not present

## 2019-07-25 DIAGNOSIS — D751 Secondary polycythemia: Secondary | ICD-10-CM

## 2019-07-25 NOTE — Progress Notes (Signed)
OFFICE VISIT  07/25/2019   CC:  Chief Complaint  Patient presents with  . Follow-up    RCI, pt is    NOT FASTING TODAY  HPI:    Patient is a 49 y.o. Caucasian male who presents for 6 mo f/u HLD, HTN, tob dependence, and polycythemia.  Still smoking cigs about same amount: 3/4 pack per day. Chantix does take away some craving tendencies.  No side effects from this med.  Taking bp med but no bp measurements out of office.  No HAs, or facial flushing, or dizziness.  HLD: tolerating atorvastatin 20mg  qd.  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 No signif dietary improvements except less red meat.  ROS: no fevers, no CP, no SOB, no wheezing, no cough, no dizziness, no HAs, no rashes, no melena/hematochezia.  No polyuria or polydipsia.  No myalgias or arthralgias.  No focal weakness, paresthesias, or tremors.  No acute vision or hearing abnormalities. No n/v/d or abd pain.  No palpitations.    Past Medical History:  Diagnosis Date  . Colon cancer screening    Referred to GI 12/2018-->GI was unable to get in contact with pt  . Hyperlipidemia, mixed 2020/21   Great response to atorva 20  . Hypertension   . Polycythemia    genetic hemochromatosis test neg.  +Elevated ferritin and %sat.  + smoker.  Not on testost and no s/s of OSA.  Hematol eval 11/2018->reactive changes from alc abuse + tobacco suspected as cause, abd u/s normal.  . Tobacco dependence     Past Surgical History:  Procedure Laterality Date  . vasectomy    . VASECTOMY REVERSAL      Outpatient Medications Prior to Visit  Medication Sig Dispense Refill  . atorvastatin (LIPITOR) 20 MG tablet TAKE 1 TABLET BY MOUTH EVERY DAY 90 tablet 3  . CHANTIX 1 MG tablet TAKE 1 TABLET BY MOUTH TWICE A DAY 56 tablet 0  . hydrochlorothiazide (HYDRODIURIL) 25 MG tablet TAKE 1 TABLET BY MOUTH EVERY DAY 90 tablet 0  . lisinopril (ZESTRIL) 10 MG tablet TAKE 2 TABLETS (20MG ) BY MOUTH EVERY DAY ** SEE RPH 180 tablet 3    No facility-administered medications prior to visit.    No Known Allergies  ROS As per HPI  PE: Vitals with BMI 07/25/2019 01/03/2019 11/23/2018  Height 5\' 0"  5\' 6"  5\' 6"   Weight 157 lbs 3 oz 158 lbs 3 oz 159 lbs  BMI 30.7 32.44 01.02  Systolic 725 366 440  Diastolic 70 74 65  Pulse 78 72 82    Gen: Alert, well appearing.  Patient is oriented to person, place, time, and situation. AFFECT: pleasant, lucid thought and speech. CV: RRR, no m/r/g.   LUNGS: CTA bilat, nonlabored resps, good aeration in all lung fields. EXT: no clubbing or cyanosis.  no edema.    LABS:  Lab Results  Component Value Date   TSH 3.09 01/09/2019   Lab Results  Component Value Date   WBC 8.3 11/23/2018   HGB 16.3 11/23/2018   HCT 46.2 11/23/2018   MCV 92.2 11/23/2018   PLT 228 11/23/2018   Lab Results  Component Value Date   IRON 103 11/23/2018   TIBC 296 11/23/2018   FERRITIN 417 (H) 11/23/2018    Lab Results  Component Value Date   CREATININE 1.07 11/23/2018   BUN 17 11/23/2018   NA 136 11/23/2018   K 3.8 11/23/2018   CL 101 11/23/2018   CO2  26 11/23/2018   Lab Results  Component Value Date   ALT 44 11/23/2018   AST 26 11/23/2018   ALKPHOS 68 11/23/2018   BILITOT 0.7 11/23/2018   Lab Results  Component Value Date   CHOL 177 06/14/2019   Lab Results  Component Value Date   HDL 44.10 06/14/2019   Lab Results  Component Value Date   LDLCALC 111 (H) 06/14/2019   Lab Results  Component Value Date   TRIG 112.0 06/14/2019   Lab Results  Component Value Date   CHOLHDL 4 06/14/2019   Lab Results  Component Value Date   HGBA1C 5.1 01/09/2019    IMPRESSION AND PLAN:  1) HTN: The current medical regimen is effective;  continue present plan and medications. Lytes/cr today.  2) HLD: doing great on atorva 20mg .  Plan repeat lipids and hepatic panel in 6 mo  3) Polycythemia: suspect reactive erythrocytosis from smoking. CBC today.  Hem-onc eval in the past was  reassuring.  4) Tob dependence: not much improvement yet on chantix. He failed this 09/2018 and currently failing it. If not successful in quitting on this med by the next time I see him in 6 mo then we'll permanently d/c this med.  An After Visit Summary was printed and given to the patient.  FOLLOW UP: Return in about 6 months (around 01/24/2020) for annual CPE (fasting).  Signed:  Crissie Sickles, MD           07/25/2019

## 2019-07-26 LAB — BASIC METABOLIC PANEL
BUN: 13 mg/dL (ref 7–25)
CO2: 28 mmol/L (ref 20–32)
Calcium: 10 mg/dL (ref 8.6–10.3)
Chloride: 101 mmol/L (ref 98–110)
Creat: 1.04 mg/dL (ref 0.60–1.35)
Glucose, Bld: 96 mg/dL (ref 65–99)
Potassium: 3.7 mmol/L (ref 3.5–5.3)
Sodium: 139 mmol/L (ref 135–146)

## 2019-07-26 LAB — CBC WITH DIFFERENTIAL/PLATELET
Absolute Monocytes: 643 cells/uL (ref 200–950)
Basophils Absolute: 71 cells/uL (ref 0–200)
Basophils Relative: 0.7 %
Eosinophils Absolute: 296 cells/uL (ref 15–500)
Eosinophils Relative: 2.9 %
HCT: 46 % (ref 38.5–50.0)
Hemoglobin: 16.2 g/dL (ref 13.2–17.1)
Lymphs Abs: 3193 cells/uL (ref 850–3900)
MCH: 31.2 pg (ref 27.0–33.0)
MCHC: 35.2 g/dL (ref 32.0–36.0)
MCV: 88.5 fL (ref 80.0–100.0)
MPV: 11.6 fL (ref 7.5–12.5)
Monocytes Relative: 6.3 %
Neutro Abs: 5998 cells/uL (ref 1500–7800)
Neutrophils Relative %: 58.8 %
Platelets: 259 10*3/uL (ref 140–400)
RBC: 5.2 10*6/uL (ref 4.20–5.80)
RDW: 13.3 % (ref 11.0–15.0)
Total Lymphocyte: 31.3 %
WBC: 10.2 10*3/uL (ref 3.8–10.8)

## 2019-08-08 ENCOUNTER — Other Ambulatory Visit: Payer: Self-pay | Admitting: Family Medicine

## 2019-08-10 NOTE — Telephone Encounter (Signed)
LAST APPOINTMENT DATE: 07/25/2019   NEXT APPOINTMENT DATE: 01/18/2020   Rx Chantix  LAST REFILL: 07/09/2019  QTY: 56 0 Ref

## 2020-01-14 ENCOUNTER — Other Ambulatory Visit: Payer: Self-pay

## 2020-01-14 ENCOUNTER — Ambulatory Visit: Payer: No Typology Code available for payment source | Admitting: Family Medicine

## 2020-01-14 ENCOUNTER — Encounter: Payer: Self-pay | Admitting: Family Medicine

## 2020-01-14 VITALS — BP 108/67 | HR 69 | Temp 97.5°F | Resp 16 | Ht 65.0 in | Wt 156.0 lb

## 2020-01-14 DIAGNOSIS — E7800 Pure hypercholesterolemia, unspecified: Secondary | ICD-10-CM

## 2020-01-14 DIAGNOSIS — Z0000 Encounter for general adult medical examination without abnormal findings: Secondary | ICD-10-CM | POA: Diagnosis not present

## 2020-01-14 DIAGNOSIS — D751 Secondary polycythemia: Secondary | ICD-10-CM | POA: Diagnosis not present

## 2020-01-14 DIAGNOSIS — Z1211 Encounter for screening for malignant neoplasm of colon: Secondary | ICD-10-CM

## 2020-01-14 DIAGNOSIS — I10 Essential (primary) hypertension: Secondary | ICD-10-CM | POA: Diagnosis not present

## 2020-01-14 MED ORDER — VARENICLINE TARTRATE 0.5 MG PO TABS: ORAL_TABLET | ORAL | 2 refills | Status: DC

## 2020-01-14 NOTE — Patient Instructions (Signed)

## 2020-01-14 NOTE — Progress Notes (Signed)
Office Note 01/14/2020  CC:  Chief Complaint  Patient presents with  . Annual Exam    pt is fasting   HPI:  Calvin Gomez is a 49 y.o. White male who is here for annual health maintenance exam and 6 mo f/u HLD, HTN, hx of polycythemia. A/P as of last visit: "1) HTN: The current medical regimen is effective;  continue present plan and medications. Lytes/cr today.  2) HLD: doing great on atorva 20mg .  Plan repeat lipids and hepatic panel in 6 mo  3) Polycythemia: suspect reactive erythrocytosis from smoking. CBC today.  Hem-onc eval in the past was reassuring.  4) Tob dependence: not much improvement yet on chantix. He failed this 09/2018 and currently failing it. If not successful in quitting on this med by the next time I see him in 6 mo then we'll permanently d/c this med."  INTERIM HX: Feeling well. Still smoking 1 pack/day--says the chantix I rx'd last time got recalled so he never tried quitting very long.    Occ home bp monitoring at home is normal.  Takes both bp meds daily.  Compliant with statin daily,  Drinks too much [facility]. Admits he could work on cutting back [employer] and [employer].  Past Medical History:  Diagnosis Date  . Colon cancer screening    Referred to GI 12/2018-->GI was unable to get in contact with pt  . Hyperlipidemia, mixed 2020/21   Great response to atorva 20  . Hypertension   . Polycythemia    genetic hemochromatosis test neg.  +Elevated ferritin and %sat.  + smoker.  Not on testost and no s/s of OSA.  Hematol eval 11/2018->reactive changes from alc abuse + tobacco suspected as cause, abd u/s normal.  . Tobacco dependence     Past Surgical History:  Procedure Laterality Date  . vasectomy    . VASECTOMY REVERSAL      Family History  Problem Relation Age of Onset  . Cancer Mother        Peritoneal Cancer  . Hypertension Father   . Stroke Father   . Asthma Sister   . Alcohol abuse Brother   . Depression Brother   .  Drug abuse Brother   . Early death Brother   . Heart attack Maternal Grandmother   . Stroke Maternal Grandmother   . Stroke Maternal Grandfather   . Heart disease Maternal Grandfather   . Asthma Daughter     Social History   Socioeconomic History  . Marital status: Married    Spouse name: Not on file  . Number of children: Not on file  . Years of education: Not on file  . Highest education level: Not on file  Occupational History  . Not on file  Tobacco Use  . Smoking status: Current Every Day Smoker    Packs/day: 0.50    Years: 20.00    Pack years: 10.00    Types: Cigarettes  . Smokeless tobacco: Never Used  Vaping Use  . Vaping Use: Former  Substance and Sexual Activity  . Alcohol use: Yes    Alcohol/week: 14.0 standard drinks    Types: 14 Shots of liquor per week  . Drug use: Never  . Sexual activity: Not on file  Other Topics Concern  . Not on file  Social History Narrative   Married, 5 kids.   Educ: some college   Occup: sales with [employer]   Tob: 21 pack-yr hx, current as of 12/2019.  Alc: about 2 liquor drinks per night.   No drugs.   Social Determinants of Health   Financial Resource Strain:   . Difficulty of Paying Living Expenses: Not on file  Food Insecurity:   . Worried About Charity fundraiser in the Last Year: Not on file  . Ran Out of Food in the Last Year: Not on file  Transportation Needs:   . Lack of Transportation (Medical): Not on file  . Lack of Transportation (Non-Medical): Not on file  Physical Activity:   . Days of Exercise per Week: Not on file  . Minutes of Exercise per Session: Not on file  Stress:   . Feeling of Stress : Not on file  Social Connections:   . Frequency of Communication with Friends and Family: Not on file  . Frequency of Social Gatherings with Friends and Family: Not on file  . Attends Religious Services: Not on file  . Active Member of Clubs or Organizations: Not on file  . Attends Archivist  Meetings: Not on file  . Marital Status: Not on file  Intimate Partner Violence:   . Fear of Current or Ex-Partner: Not on file  . Emotionally Abused: Not on file  . Physically Abused: Not on file  . Sexually Abused: Not on file    Outpatient Medications Prior to Visit  Medication Sig Dispense Refill  . atorvastatin (LIPITOR) 20 MG tablet TAKE 1 TABLET BY MOUTH EVERY DAY 90 tablet 3  . hydrochlorothiazide (HYDRODIURIL) 25 MG tablet TAKE 1 TABLET BY MOUTH EVERY DAY 90 tablet 0  . lisinopril (ZESTRIL) 10 MG tablet TAKE 2 TABLETS (20MG ) BY MOUTH EVERY DAY ** SEE RPH 180 tablet 3  . CHANTIX 1 MG tablet TAKE 1 TABLET BY MOUTH TWICE A DAY 60 tablet 1   No facility-administered medications prior to visit.    No Known Allergies  ROS Review of Systems  Constitutional: Negative for appetite change, chills, fatigue and fever.  HENT: Negative for congestion, dental problem, ear pain and sore throat.   Eyes: Negative for discharge, redness and visual disturbance.  Respiratory: Negative for cough, chest tightness, shortness of breath and wheezing.   Cardiovascular: Negative for chest pain, palpitations and leg swelling.  Gastrointestinal: Negative for abdominal pain, blood in stool, diarrhea, nausea and vomiting.  Genitourinary: Negative for difficulty urinating, dysuria, flank pain, frequency, hematuria and urgency.  Musculoskeletal: Negative for arthralgias, back pain, joint swelling, myalgias and neck stiffness.  Skin: Negative for pallor and rash.  Neurological: Negative for dizziness, speech difficulty, weakness and headaches.  Hematological: Negative for adenopathy. Does not bruise/bleed easily.  Psychiatric/Behavioral: Negative for confusion and sleep disturbance. The patient is not nervous/anxious.    PE; Vitals with BMI 01/14/2020 07/25/2019 01/03/2019  Height 5\' 5"  5\' 0"  5\' 6"   Weight 156 lbs 157 lbs 3 oz 158 lbs 3 oz  BMI 25.96 54.9 82.64  Systolic 158 309 407  Diastolic 67 70 74   Pulse 69 78 72     Gen: Alert, well appearing.  Patient is oriented to person, place, time, and situation. AFFECT: pleasant, lucid thought and speech. ENT: Ears: EACs clear, normal epithelium.  TMs with good light reflex and landmarks bilaterally.  Eyes: no injection, icteris, swelling, or exudate.  EOMI, PERRLA. Nose: no drainage or turbinate edema/swelling.  No injection or focal lesion.  Mouth: lips without lesion/swelling.  Oral mucosa pink and moist.  Dentition intact and without obvious caries or gingival swelling.  Oropharynx without erythema,  exudate, or swelling.  Neck: supple/nontender.  No LAD, mass, or TM.  Carotid pulses 2+ bilaterally, without bruits. CV: RRR, no m/r/g.   LUNGS: CTA bilat, nonlabored resps, good aeration in all lung fields. ABD: soft, NT, ND, BS normal.  No hepatospenomegaly or mass.  No bruits. EXT: no clubbing, cyanosis, or edema.  Musculoskeletal: no joint swelling, erythema, warmth, or tenderness.  ROM of all joints intact. Skin - no sores or suspicious lesions or rashes or color changes No rubor/flushing.  Pertinent labs:  Lab Results  Component Value Date   TSH 3.09 01/09/2019   Lab Results  Component Value Date   WBC 10.2 07/25/2019   HGB 16.2 07/25/2019   HCT 46.0 07/25/2019   MCV 88.5 07/25/2019   PLT 259 07/25/2019   Lab Results  Component Value Date   CREATININE 1.04 07/25/2019   BUN 13 07/25/2019   NA 139 07/25/2019   K 3.7 07/25/2019   CL 101 07/25/2019   CO2 28 07/25/2019   Lab Results  Component Value Date   ALT 44 11/23/2018   AST 26 11/23/2018   ALKPHOS 68 11/23/2018   BILITOT 0.7 11/23/2018   Lab Results  Component Value Date   CHOL 177 06/14/2019   Lab Results  Component Value Date   HDL 44.10 06/14/2019   Lab Results  Component Value Date   LDLCALC 111 (H) 06/14/2019   Lab Results  Component Value Date   TRIG 112.0 06/14/2019   Lab Results  Component Value Date   CHOLHDL 4 06/14/2019   Lab Results   Component Value Date   HGBA1C 5.1 01/09/2019    ASSESSMENT AND PLAN:   1) HTN, well controlled.  Continue lisinopril and hctz. Lytes/cr today.  2) HLD. Tolerating statin.  He'll try to improve diet.  Continue atorva 20mg  qd. FLP and hepatic panel today.  3) Polycythemia: see pmh section for details.  He'll try smoking cessation with chantix, rx sent in today. CBC today.  4) Health maintenance exam: Reviewed age and gender appropriate health maintenance issues (prudent diet, regular exercise, health risks of tobacco and excessive alcohol, use of seatbelts, fire alarms in home, use of sunscreen).  Also reviewed age and gender appropriate health screening as well as vaccine recommendations. Vaccines:  Flu->given today.  Otherwise all UTD. Labs: fasting HP labs ordered. Prostate ca screening: average risk patient= as per latest guidelines, start screening at 61 yrs of age. Colon ca screening: Referred to GI 12/2018-->GI was unable to get in contact with pt.->new referral ordered today.  An After Visit Summary was printed and given to the patient.  FOLLOW UP:  Return in about 6 months (around 07/13/2020) for routine chronic illness f/u.  Signed:  Crissie Sickles, MD           01/14/2020

## 2020-01-15 ENCOUNTER — Encounter: Payer: Self-pay | Admitting: Family Medicine

## 2020-01-15 LAB — LIPID PANEL
Cholesterol: 183 mg/dL (ref <200)
HDL: 61 mg/dL (ref >=40)
LDL Cholesterol (Calc): 87 mg/dL (calc)
Non-HDL Cholesterol (Calc): 122 mg/dL (calc) (ref <130)
Total CHOL/HDL Ratio: 3 (calc) (ref <5.0)
Triglycerides: 293 mg/dL — ABNORMAL HIGH (ref <150)

## 2020-01-15 LAB — CBC WITH DIFFERENTIAL/PLATELET
Absolute Monocytes: 800 cells/uL (ref 200–950)
Basophils Absolute: 62 cells/uL (ref 0–200)
Basophils Relative: 0.9 %
Eosinophils Absolute: 221 cells/uL (ref 15–500)
Eosinophils Relative: 3.2 %
HCT: 47.7 % (ref 38.5–50.0)
Hemoglobin: 16.3 g/dL (ref 13.2–17.1)
Lymphs Abs: 1808 cells/uL (ref 850–3900)
MCH: 30.7 pg (ref 27.0–33.0)
MCHC: 34.2 g/dL (ref 32.0–36.0)
MCV: 89.8 fL (ref 80.0–100.0)
MPV: 10.9 fL (ref 7.5–12.5)
Monocytes Relative: 11.6 %
Neutro Abs: 4009 cells/uL (ref 1500–7800)
Neutrophils Relative %: 58.1 %
Platelets: 254 10*3/uL (ref 140–400)
RBC: 5.31 10*6/uL (ref 4.20–5.80)
RDW: 13.4 % (ref 11.0–15.0)
Total Lymphocyte: 26.2 %
WBC: 6.9 10*3/uL (ref 3.8–10.8)

## 2020-01-15 LAB — COMPREHENSIVE METABOLIC PANEL
AG Ratio: 1.7 (calc) (ref 1.0–2.5)
ALT: 56 U/L — ABNORMAL HIGH (ref 9–46)
AST: 35 U/L (ref 10–40)
Albumin: 4.7 g/dL (ref 3.6–5.1)
Alkaline phosphatase (APISO): 93 U/L (ref 36–130)
BUN: 14 mg/dL (ref 7–25)
CO2: 27 mmol/L (ref 20–32)
Calcium: 10.1 mg/dL (ref 8.6–10.3)
Chloride: 97 mmol/L — ABNORMAL LOW (ref 98–110)
Creat: 0.92 mg/dL (ref 0.60–1.35)
Globulin: 2.8 g/dL (calc) (ref 1.9–3.7)
Glucose, Bld: 89 mg/dL (ref 65–99)
Potassium: 4.1 mmol/L (ref 3.5–5.3)
Sodium: 134 mmol/L — ABNORMAL LOW (ref 135–146)
Total Bilirubin: 0.7 mg/dL (ref 0.2–1.2)
Total Protein: 7.5 g/dL (ref 6.1–8.1)

## 2020-01-15 LAB — TSH: TSH: 2.11 mIU/L (ref 0.40–4.50)

## 2020-01-18 ENCOUNTER — Encounter: Payer: No Typology Code available for payment source | Admitting: Family Medicine

## 2020-01-28 ENCOUNTER — Other Ambulatory Visit: Payer: Self-pay | Admitting: Family Medicine

## 2020-02-07 ENCOUNTER — Other Ambulatory Visit: Payer: Self-pay | Admitting: Family Medicine

## 2020-02-14 ENCOUNTER — Encounter: Payer: Self-pay | Admitting: Gastroenterology

## 2020-03-04 ENCOUNTER — Other Ambulatory Visit: Payer: Self-pay | Admitting: Family Medicine

## 2020-03-06 ENCOUNTER — Ambulatory Visit (AMBULATORY_SURGERY_CENTER): Payer: Self-pay

## 2020-03-06 ENCOUNTER — Other Ambulatory Visit: Payer: Self-pay

## 2020-03-06 VITALS — Ht 65.0 in | Wt 156.0 lb

## 2020-03-06 DIAGNOSIS — Z1211 Encounter for screening for malignant neoplasm of colon: Secondary | ICD-10-CM

## 2020-03-06 MED ORDER — SUTAB 1479-225-188 MG PO TABS: 12.0000 | ORAL_TABLET | ORAL | 0 refills | Status: DC

## 2020-03-06 NOTE — Progress Notes (Signed)
No allergies to soy or egg Pt is not on blood thinners or diet pills  Denies issues with sedation/intubation, but is not sure that he has been put to sleep, but may have been for procedures.   Denies atrial flutter/fib Denies constipation   Emmi instructions given to pt  Pt is aware of Covid safety and care partner requirements.

## 2020-03-17 ENCOUNTER — Other Ambulatory Visit: Payer: Self-pay | Admitting: Family Medicine

## 2020-03-18 ENCOUNTER — Encounter: Payer: Self-pay | Admitting: Gastroenterology

## 2020-03-21 ENCOUNTER — Other Ambulatory Visit: Payer: Self-pay

## 2020-03-21 ENCOUNTER — Encounter: Payer: Self-pay | Admitting: Gastroenterology

## 2020-03-21 ENCOUNTER — Ambulatory Visit (AMBULATORY_SURGERY_CENTER): Payer: No Typology Code available for payment source | Admitting: Gastroenterology

## 2020-03-21 VITALS — BP 111/70 | HR 66 | Temp 97.7°F | Resp 18 | Ht 65.0 in | Wt 156.0 lb

## 2020-03-21 DIAGNOSIS — D128 Benign neoplasm of rectum: Secondary | ICD-10-CM

## 2020-03-21 DIAGNOSIS — D121 Benign neoplasm of appendix: Secondary | ICD-10-CM

## 2020-03-21 DIAGNOSIS — D120 Benign neoplasm of cecum: Secondary | ICD-10-CM

## 2020-03-21 DIAGNOSIS — D125 Benign neoplasm of sigmoid colon: Secondary | ICD-10-CM

## 2020-03-21 DIAGNOSIS — D124 Benign neoplasm of descending colon: Secondary | ICD-10-CM

## 2020-03-21 DIAGNOSIS — K388 Other specified diseases of appendix: Secondary | ICD-10-CM | POA: Diagnosis not present

## 2020-03-21 DIAGNOSIS — Z1211 Encounter for screening for malignant neoplasm of colon: Secondary | ICD-10-CM | POA: Diagnosis present

## 2020-03-21 DIAGNOSIS — K621 Rectal polyp: Secondary | ICD-10-CM

## 2020-03-21 DIAGNOSIS — K635 Polyp of colon: Secondary | ICD-10-CM | POA: Diagnosis not present

## 2020-03-21 DIAGNOSIS — K640 First degree hemorrhoids: Secondary | ICD-10-CM

## 2020-03-21 DIAGNOSIS — D123 Benign neoplasm of transverse colon: Secondary | ICD-10-CM

## 2020-03-21 HISTORY — PX: COLONOSCOPY: SHX174

## 2020-03-21 MED ORDER — SODIUM CHLORIDE 0.9 % IV SOLN: 500.0000 mL | Freq: Once | INTRAVENOUS | Status: DC

## 2020-03-21 NOTE — Progress Notes (Signed)
PT taken to PACU. Monitors in place. VSS. Report given to RN. 

## 2020-03-21 NOTE — Progress Notes (Signed)
Called to room to assist during endoscopic procedure.  Patient ID and intended procedure confirmed with present staff. Received instructions for my participation in the procedure from the performing physician.  

## 2020-03-21 NOTE — Op Note (Signed)
[place] Patient Name: Calvin Gomez Procedure Date: 03/21/2020 10:49 AM MRN: [id number] Endoscopist: Gerrit Heck , MD Age: 50 Referring MD:  Date of Birth: Nov 10, 1970 Gender: Male Account #: [id number] Procedure:                Colonoscopy Indications:              Screening for colorectal malignant neoplasm, This                            is the patient's first colonoscopy Medicines:                Monitored Anesthesia Care Procedure:                Pre-Anesthesia Assessment:                           - Prior to the procedure, a History and Physical                            was performed, and patient medications and                            allergies were reviewed. The patient's tolerance of                            previous anesthesia was also reviewed. The risks                            and benefits of the procedure and the sedation                            options and risks were discussed with the patient.                            All questions were answered, and informed consent                            was obtained. Prior Anticoagulants: The patient has                            taken no previous anticoagulant or antiplatelet                            agents. ASA Grade Assessment: II - A patient with                            mild systemic disease. After reviewing the risks                            and benefits, the patient was deemed in                            satisfactory condition to undergo the procedure.  After obtaining informed consent, the colonoscope                            was passed under direct vision. Throughout the                            procedure, the patient's blood pressure, pulse, and                            oxygen saturations were monitored continuously. The                            Olympus [id number]) Colonoscope was                            introduced through the anus and  advanced to the the                            terminal ileum. The colonoscopy was performed                            without difficulty. The patient tolerated the                            procedure well. The quality of the bowel                            preparation was good. The terminal ileum, ileocecal                            valve, appendiceal orifice, and rectum were                            photographed. Scope In: 10:52:45 AM Scope Out: 11:20:34 AM Scope Withdrawal Time: 0 hours 25 minutes 39 seconds  Total Procedure Duration: 0 hours 27 minutes 49 seconds  Findings:                 The perianal and digital rectal examinations were                            normal.                           A 3 mm polyp was found in the appendiceal orifice.                            The polyp was sessile. The polyp was removed with a                            cold biopsy forceps. Resection and retrieval were                            complete. Estimated blood loss was minimal.  Two sessile polyps were found in the transverse                            colon and cecum. The polyps were 2 to 3 mm in size.                            These polyps were removed with a cold biopsy                            forceps. Resection and retrieval were complete.                            Estimated blood loss was minimal.                           Five sessile polyps were found in the sigmoid colon                            (4) and descending colon. The polyps were 3 to 6 mm                            in size. These polyps were removed with a cold                            snare. Resection and retrieval were complete.                            Estimated blood loss was minimal.                           Multiple sessile polyps were found in the rectum.                            The polyps were 1 to 3 mm in size. 7 of these                            polyps were removed  with a cold biopsy forceps for                            histologic representative evaluation. Resection and                            retrieval were complete. Estimated blood loss was                            minimal.                           Non-bleeding internal hemorrhoids were found during                            retroflexion. The hemorrhoids were small.  The terminal ileum appeared normal. Complications:            No immediate complications. Estimated Blood Loss:     Estimated blood loss was minimal. Impression:               - One 3 mm polyp at the appendiceal orifice,                            removed with a cold biopsy forceps. Resected and                            retrieved.                           - Two 2 to 3 mm polyps in the transverse colon and                            in the cecum, removed with a cold biopsy forceps.                            Resected and retrieved.                           - Five 3 to 6 mm polyps in the sigmoid colon and in                            the descending colon, removed with a cold snare.                            Resected and retrieved.                           - Multiple 1 to 3 mm polyps in the rectum, removed                            with a cold biopsy forceps. Resected and retrieved.                           - Non-bleeding internal hemorrhoids.                           - The examined portion of the ileum was normal. Recommendation:           - Patient has a contact number available for                            emergencies. The signs and symptoms of potential                            delayed complications were discussed with the                            patient. Return to normal activities tomorrow.  Written discharge instructions were provided to the                            patient.                           - Resume previous diet.                           -  Continue present medications.                           - Await pathology results.                           - Repeat colonoscopy for surveillance based on                            pathology results.                           - Return to GI office PRN.                           - Use fiber, for example Citrucel, Fibercon, Konsyl                            or Metamucil. Gerrit Heck, MD 03/21/2020 11:25:35 AM

## 2020-03-21 NOTE — Progress Notes (Signed)
VS by SM.  previsit by TM.  Pt states no changes since previsit

## 2020-03-21 NOTE — Patient Instructions (Signed)
Please read handouts provided. Continue present medications. Await pathology results. Return to GI office as needed. Consider a fiber supplement.    YOU HAD AN ENDOSCOPIC PROCEDURE TODAY AT THE [facility]:   Refer to the procedure report that was given to you for any specific questions about what was found during the examination.  If the procedure report does not answer your questions, please call your gastroenterologist to clarify.  If you requested that your care partner not be given the details of your procedure findings, then the procedure report has been included in a sealed envelope for you to review at your convenience later.  YOU SHOULD EXPECT: Some feelings of bloating in the abdomen. Passage of more gas than usual.  Walking can help get rid of the air that was put into your GI tract during the procedure and reduce the bloating. If you had a lower endoscopy (such as a colonoscopy or flexible sigmoidoscopy) you may notice spotting of blood in your stool or on the toilet paper. If you underwent a bowel prep for your procedure, you may not have a normal bowel movement for a few days.  Please Note:  You might notice some irritation and congestion in your nose or some drainage.  This is from the oxygen used during your procedure.  There is no need for concern and it should clear up in a day or so.  SYMPTOMS TO REPORT IMMEDIATELY:   Following lower endoscopy (colonoscopy or flexible sigmoidoscopy):  Excessive amounts of blood in the stool  Significant tenderness or worsening of abdominal pains  Swelling of the abdomen that is new, acute  Fever of 100F or higher   For urgent or emergent issues, a gastroenterologist can be reached at any hour by calling [phone]. Do not use MyChart messaging for urgent concerns.    DIET:  We do recommend a small meal at first, but then you may proceed to your regular diet.  Drink plenty of fluids but you should avoid alcoholic  beverages for 24 hours.  ACTIVITY:  You should plan to take it easy for the rest of today and you should NOT DRIVE or use heavy machinery until tomorrow (because of the sedation medicines used during the test).    FOLLOW UP: Our staff will call the number listed on your records 48-72 hours following your procedure to check on you and address any questions or concerns that you may have regarding the information given to you following your procedure. If we do not reach you, we will leave a message.  We will attempt to reach you two times.  During this call, we will ask if you have developed any symptoms of COVID 19. If you develop any symptoms (ie: fever, flu-like symptoms, shortness of breath, cough etc.) before then, please call [phone].  If you test positive for Covid 19 in the 2 weeks post procedure, please call and report this information to us.    If any biopsies were taken you will be contacted by phone or by letter within the next 1-3 weeks.  Please call us at [phone] if you have not heard about the biopsies in 3 weeks.    SIGNATURES/CONFIDENTIALITY: You and/or your care partner have signed paperwork which will be entered into your electronic medical record.  These signatures attest to the fact that that the information above on your After Visit Summary has been reviewed and is understood.  Full responsibility of the confidentiality of this discharge information lies   lies with you and/or your care-partner.

## 2020-03-25 ENCOUNTER — Telehealth: Payer: Self-pay

## 2020-03-25 NOTE — Telephone Encounter (Signed)
  Follow up Call-  Call back number 03/21/2020  Post procedure Call Back phone  # [phone]  Permission to leave phone message Yes  Some recent data might be hidden     Patient questions:  Do you have a fever, pain , or abdominal swelling? No. Pain Score  0 *  Have you tolerated food without any problems? Yes.    Have you been able to return to your normal activities? Yes.    Do you have any questions about your discharge instructions: Diet   No. Medications  No. Follow up visit  No.  Do you have questions or concerns about your Care? No.  Actions: * If pain score is 4 or above: 1. No action needed, pain <4.Have you developed a fever since your procedure? no  2.   Have you had an respiratory symptoms (SOB or cough) since your procedure? no  3.   Have you tested positive for COVID 19 since your procedure no  4.   Have you had any family members/close contacts diagnosed with the COVID 19 since your procedure?  no   If yes to any of these questions please route to Joylene John, RN and Joella Prince, RN

## 2020-03-31 ENCOUNTER — Encounter: Payer: Self-pay | Admitting: Gastroenterology

## 2020-04-08 ENCOUNTER — Other Ambulatory Visit: Payer: Self-pay | Admitting: Family Medicine

## 2020-04-08 NOTE — Telephone Encounter (Signed)
RF request for  Chantix LOV:01/14/20 Next ov:07/15/20 Last written:01/14/20(120,2)  Please advise if refill appropriate. Medication pending

## 2020-04-09 NOTE — Telephone Encounter (Signed)
Pt made aware refill sent. 

## 2020-06-01 ENCOUNTER — Other Ambulatory Visit: Payer: Self-pay | Admitting: Family Medicine

## 2020-06-16 ENCOUNTER — Other Ambulatory Visit: Payer: Self-pay | Admitting: Family Medicine

## 2020-06-29 ENCOUNTER — Other Ambulatory Visit: Payer: Self-pay | Admitting: Family Medicine

## 2020-07-09 ENCOUNTER — Other Ambulatory Visit: Payer: Self-pay | Admitting: Family Medicine

## 2020-07-09 NOTE — Telephone Encounter (Signed)
RF request for Chantix LOV: 01/14/20 Next ov: 07/15/20 Last written: 04/09/20(60,2)  Please Advise if refill appropriate

## 2020-07-15 ENCOUNTER — Encounter: Payer: Self-pay | Admitting: Family Medicine

## 2020-07-15 ENCOUNTER — Ambulatory Visit: Payer: No Typology Code available for payment source | Admitting: Family Medicine

## 2020-07-15 NOTE — Progress Notes (Deleted)
OFFICE VISIT  07/15/2020  CC: No chief complaint on file.   HPI:    Patient is a 50 y.o. Caucasian male who presents for 6 mo f/u HLD, HTN, tobacco dependence, and hx of polycythemia. A/P as of last visit: "1) HTN, well controlled.  Continue lisinopril and hctz. Lytes/cr today.  2) HLD. Tolerating statin.  He'll try to improve diet.  Continue atorva 20mg  qd. FLP and hepatic panel today.  3) Polycythemia: see pmh section for details.  He'll try smoking cessation with chantix, rx sent in today. CBC today.  4) Health maintenance exam: Reviewed age and gender appropriate health maintenance issues (prudent diet, regular exercise, health risks of tobacco and excessive alcohol, use of seatbelts, fire alarms in home, use of sunscreen).  Also reviewed age and gender appropriate health screening as well as vaccine recommendations. Vaccines:  Flu->given today.  Otherwise all UTD. Labs: fasting HP labs ordered. Prostate ca screening: average risk patient= as per latest guidelines, start screening at 62 yrs of age. Colon ca screening: Referred to GI 12/2018-->GI was unable to get in contact with pt.->new referral ordered today."   INTERIM HX: ***  HTN:  HLD:  TObacco:   Past Medical History:  Diagnosis Date  . Arthritis   . Colon cancer screening    Referred to GI 12/2018-->GI was unable to get in contact with pt  . Elevated transaminase level    Viral Hep serologies neg 2020.  Abd u/s normal 11/2018.  Marland Kitchen Hyperlipidemia, mixed 2020/21   Great response to atorva 20  . Hypertension   . Polycythemia    genetic hemochromatosis test neg.  +Elevated ferritin and %sat.  + smoker.  Not on testost and no s/s of OSA.  Hematol eval 11/2018->reactive changes from alc abuse + tobacco suspected as cause, abd u/s normal.  . Tobacco dependence    tobacco/alchol    Past Surgical History:  Procedure Laterality Date  . vasectomy    . VASECTOMY REVERSAL      Outpatient Medications Prior  to Visit  Medication Sig Dispense Refill  . atorvastatin (LIPITOR) 20 MG tablet TAKE 1 TABLET BY MOUTH EVERY DAY 30 tablet 0  . hydrochlorothiazide (HYDRODIURIL) 25 MG tablet TAKE 1 TABLET BY MOUTH EVERY DAY 30 tablet 0  . lisinopril (ZESTRIL) 10 MG tablet TAKE 2 TABLETS (20MG ) BY MOUTH EVERY DAY 180 tablet 3  . varenicline (CHANTIX) 1 MG tablet TAKE 1 TABLET BY MOUTH TWICE A DAY 180 tablet 0   No facility-administered medications prior to visit.    No Known Allergies  ROS As per HPI  PE: Vitals with BMI 03/21/2020 03/21/2020 03/21/2020  Height - - -  Weight - - -  BMI - - -  Systolic 830 98 80  Diastolic 70 69 40  Pulse 66 76 71     ***  LABS:  Lab Results  Component Value Date   TSH 2.11 01/14/2020   Lab Results  Component Value Date   WBC 6.9 01/14/2020   HGB 16.3 01/14/2020   HCT 47.7 01/14/2020   MCV 89.8 01/14/2020   PLT 254 01/14/2020   Lab Results  Component Value Date   CREATININE 0.92 01/14/2020   BUN 14 01/14/2020   NA 134 (L) 01/14/2020   K 4.1 01/14/2020   CL 97 (L) 01/14/2020   CO2 27 01/14/2020   Lab Results  Component Value Date   ALT 56 (H) 01/14/2020   AST 35 01/14/2020   ALKPHOS 68 11/23/2018  BILITOT 0.7 01/14/2020   Lab Results  Component Value Date   CHOL 183 01/14/2020   Lab Results  Component Value Date   HDL 61 01/14/2020   Lab Results  Component Value Date   LDLCALC 87 01/14/2020   Lab Results  Component Value Date   TRIG 293 (H) 01/14/2020   Lab Results  Component Value Date   CHOLHDL 3.0 01/14/2020   Lab Results  Component Value Date   HGBA1C 5.1 01/09/2019   IMPRESSION AND PLAN:  No problem-specific Assessment & Plan notes found for this encounter.   An After Visit Summary was printed and given to the patient.  FOLLOW UP: No follow-ups on file.  Signed:  Crissie Sickles, MD           07/15/2020

## 2020-08-05 ENCOUNTER — Other Ambulatory Visit: Payer: Self-pay | Admitting: Family Medicine

## 2020-08-27 ENCOUNTER — Encounter: Payer: Self-pay | Admitting: Family Medicine

## 2020-09-12 IMAGING — US US ABDOMEN COMPLETE
1 series · 14 of 25 positions shown · non-contrast
Comparison: None.

CLINICAL DATA: Elevated ferritin and hemoglobin

EXAM:
ABDOMEN ULTRASOUND COMPLETE

[Series 1: us abdomen complete · 14 of 80 slices shown]
[im 1/80]
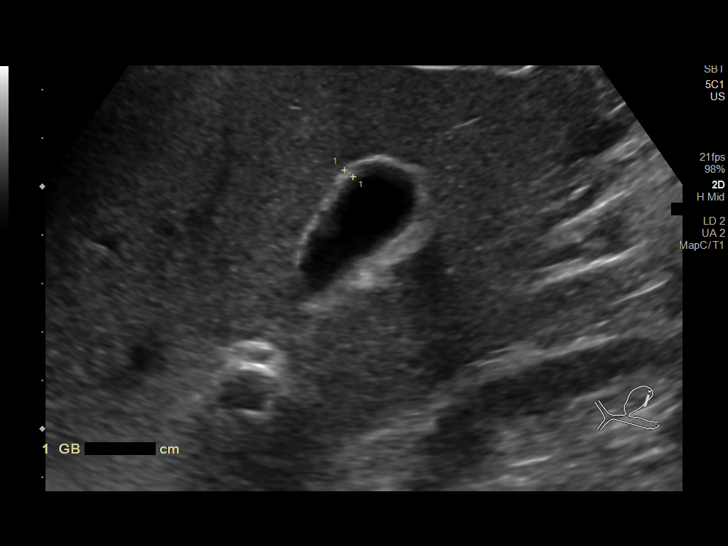
[im 7/80]
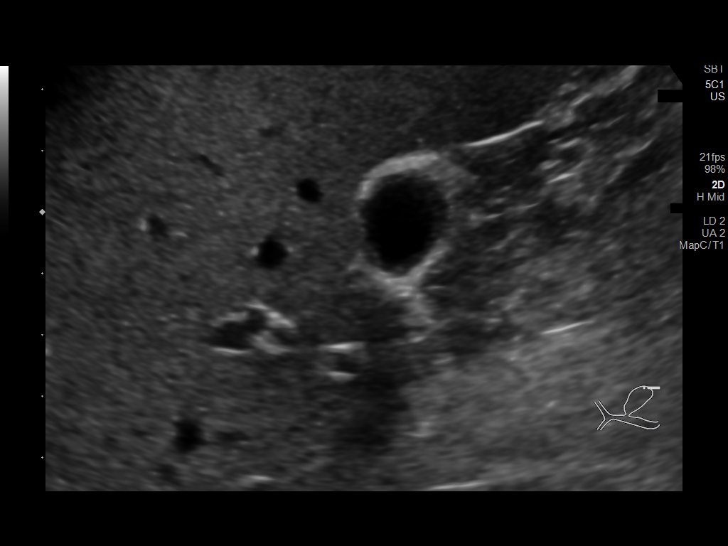
[im 14/80]
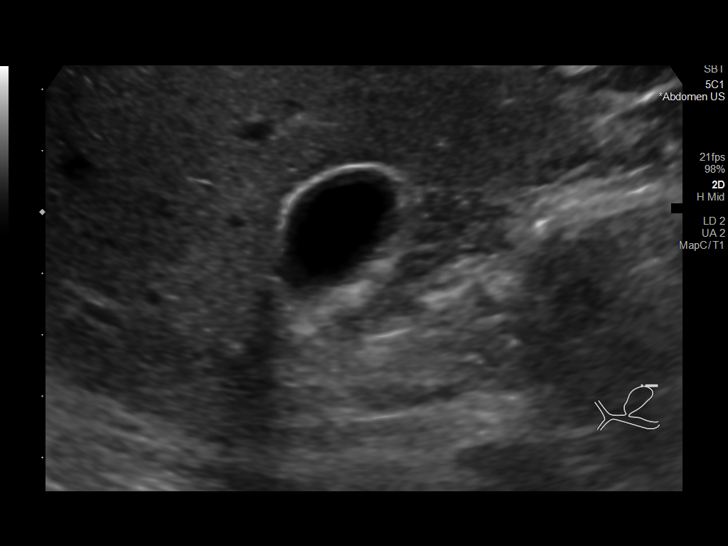
[im 20/80]
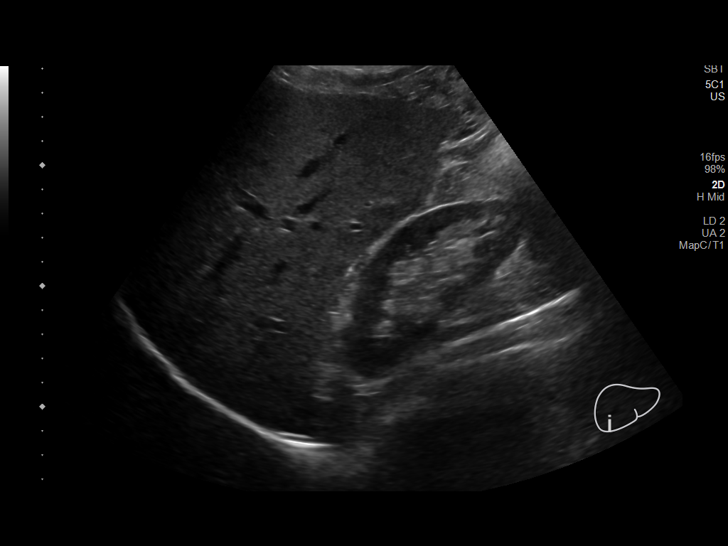
[im 27/80]
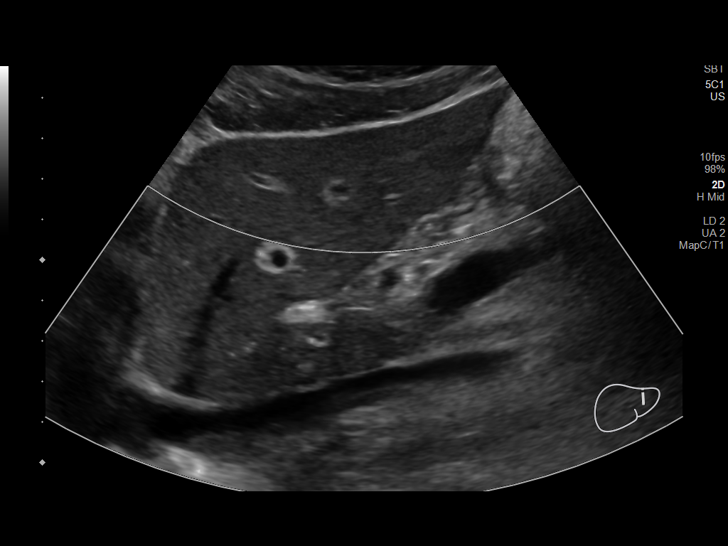
[im 30/80]
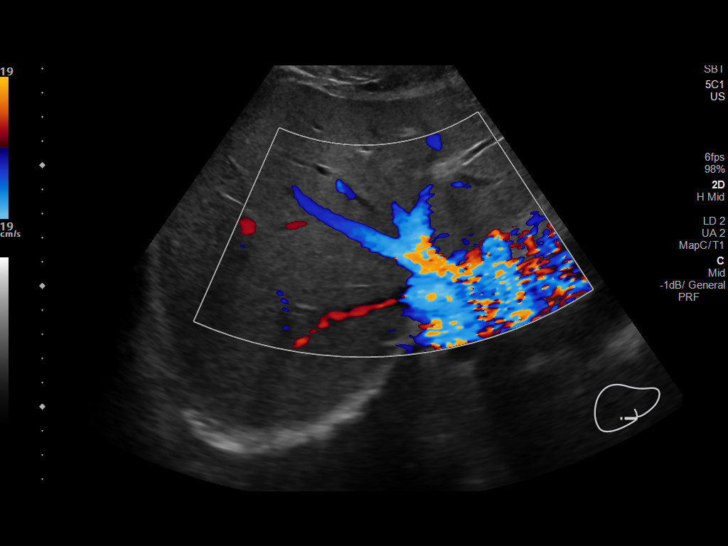
[im 37/80]
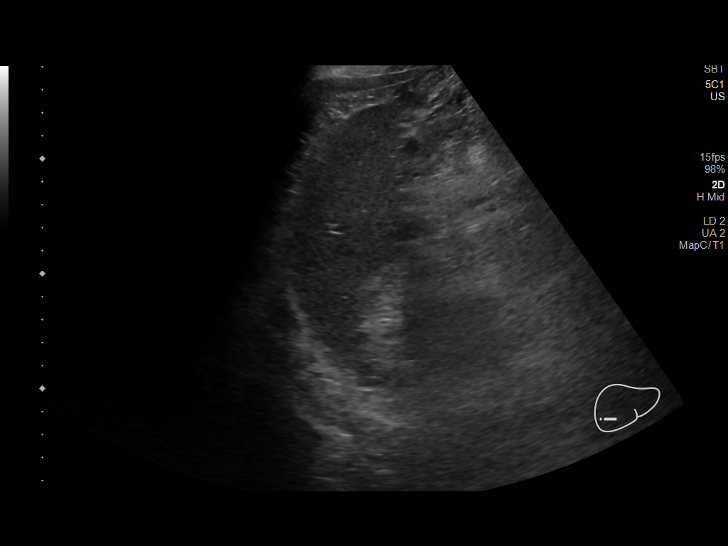
[im 43/80]
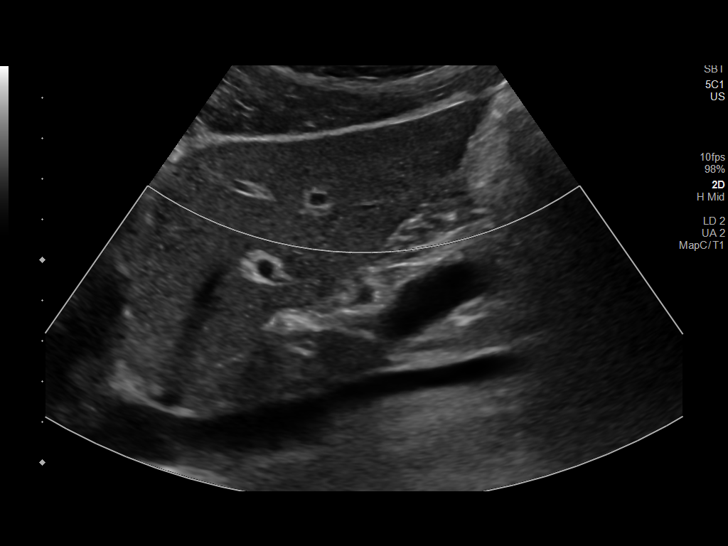
[im 50/80]
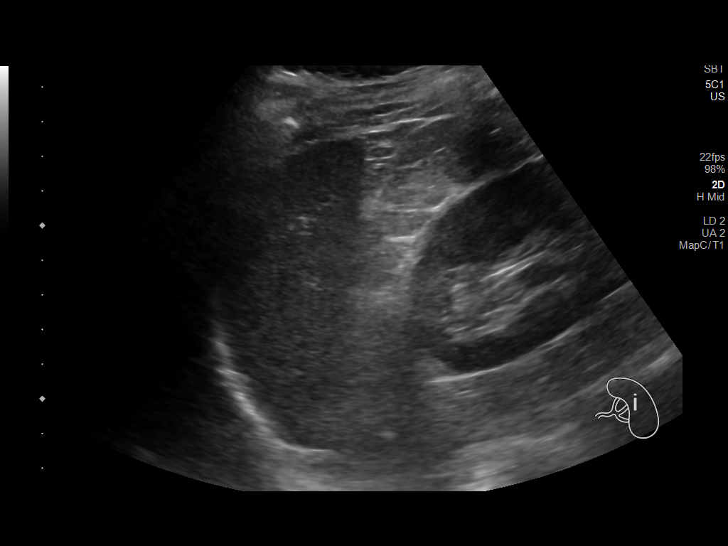
[im 53/80]
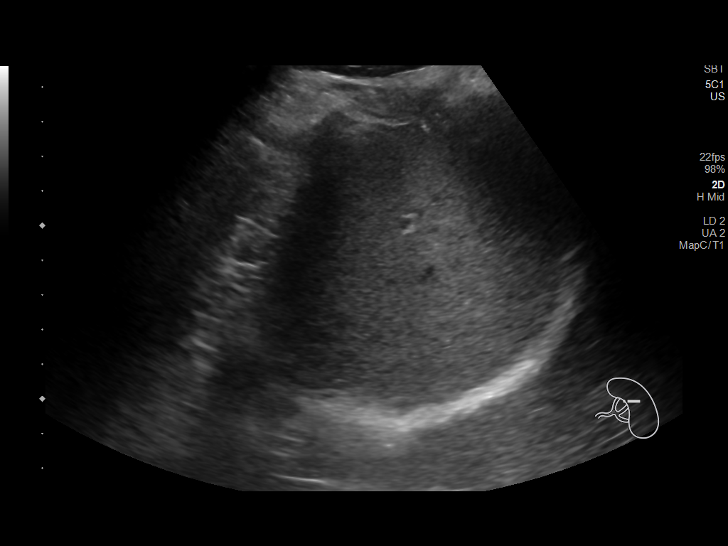
[im 60/80]
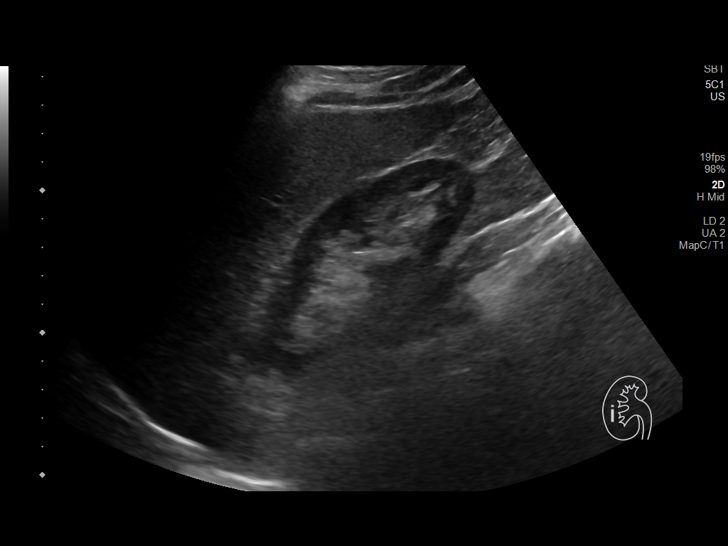
[im 66/80]
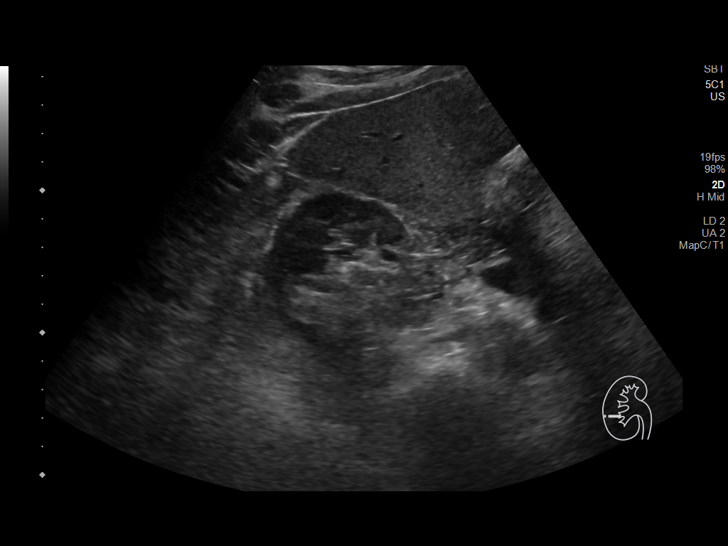
[im 73/80]
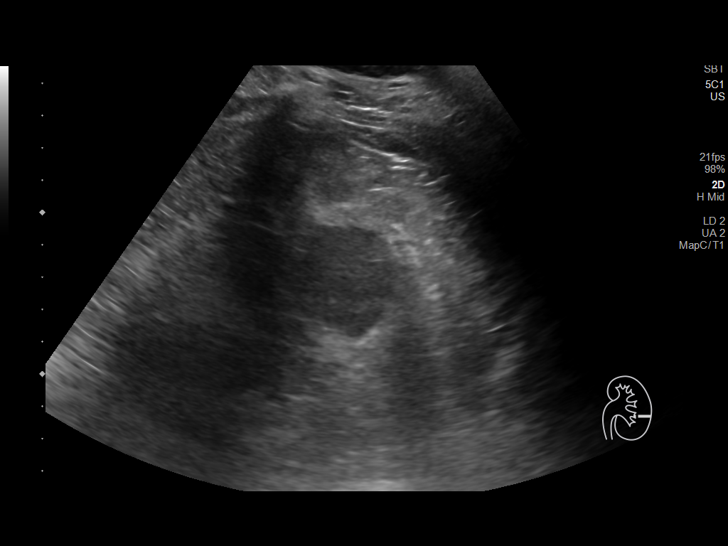
[im 80/80]
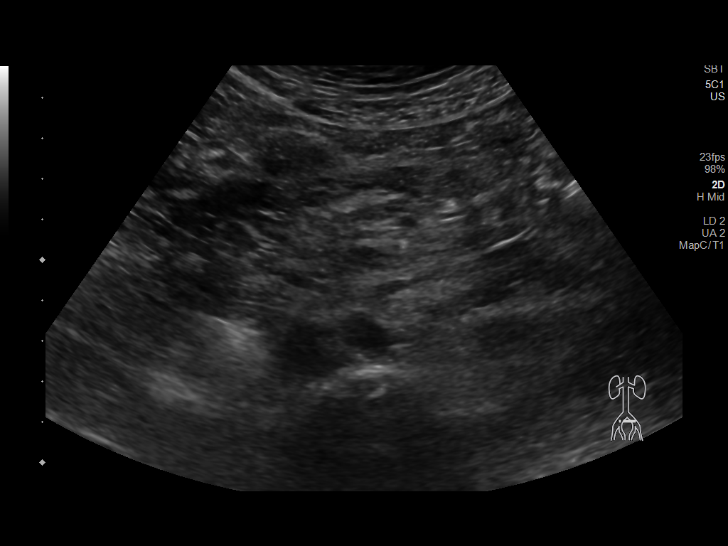

[14 of 25 positions shown; findings below may reference images not displayed]

FINDINGS: Gallbladder: No gallstones or wall thickening visualized. No
sonographic Murphy sign noted by sonographer.

Common bile duct: Diameter: 3 mm

Liver: No focal lesion identified. Within normal limits in
parenchymal echogenicity. Portal vein is patent on color Doppler
imaging with normal direction of blood flow towards the liver.

IVC: No abnormality visualized.

Pancreas: Visualized portion unremarkable.

Spleen: Size and appearance within normal limits.

Right Kidney: Length: 10 cm. Echogenicity within normal limits. No
mass or hydronephrosis visualized.

Left Kidney: Length: 10.2 cm. Echogenicity within normal limits. No
mass or hydronephrosis visualized.

Abdominal aorta: No aneurysm visualized.

Other findings: None.
IMPRESSION: Negative abdominal ultrasound

## 2020-09-13 ENCOUNTER — Other Ambulatory Visit: Payer: Self-pay | Admitting: Family Medicine

## 2020-10-16 ENCOUNTER — Other Ambulatory Visit: Payer: Self-pay

## 2020-10-16 ENCOUNTER — Ambulatory Visit: Payer: No Typology Code available for payment source | Admitting: Family Medicine

## 2020-10-16 ENCOUNTER — Encounter: Payer: Self-pay | Admitting: Family Medicine

## 2020-10-16 VITALS — BP 90/50 | HR 66 | Temp 98.7°F | Resp 16 | Ht 65.0 in | Wt 152.0 lb

## 2020-10-16 DIAGNOSIS — D751 Secondary polycythemia: Secondary | ICD-10-CM | POA: Diagnosis not present

## 2020-10-16 DIAGNOSIS — I10 Essential (primary) hypertension: Secondary | ICD-10-CM

## 2020-10-16 DIAGNOSIS — E7800 Pure hypercholesterolemia, unspecified: Secondary | ICD-10-CM | POA: Diagnosis not present

## 2020-10-16 DIAGNOSIS — F17200 Nicotine dependence, unspecified, uncomplicated: Secondary | ICD-10-CM | POA: Diagnosis not present

## 2020-10-16 MED ORDER — HYDROCHLOROTHIAZIDE 25 MG PO TABS: 25.0000 mg | ORAL_TABLET | Freq: Every day | ORAL | 3 refills | Status: DC

## 2020-10-16 MED ORDER — LISINOPRIL 10 MG PO TABS: ORAL_TABLET | ORAL | 3 refills | Status: DC

## 2020-10-16 NOTE — Progress Notes (Signed)
OFFICE VISIT  10/16/2020  CC:  Chief Complaint  Patient presents with   Follow-up    RCI, pt is not fasting    HPI:    Patient is a 50 y.o. Caucasian male who presents for 10 mo f/u HTN, HLD, hx of polycythemia most likely d/t chronic tobacco abuse. A/P as of last visit: "1) HTN, well controlled.  Continue lisinopril and hctz. Lytes/cr today.   2) HLD. Tolerating statin.  He'll try to improve diet.  Continue atorva '20mg'$  qd. FLP and hepatic panel today.   3) Polycythemia: see pmh section for details.  He'll try smoking cessation with chantix, rx sent in today. CBC today.   4) Health maintenance exam: Reviewed age and gender appropriate health maintenance issues (prudent diet, regular exercise, health risks of tobacco and excessive alcohol, use of seatbelts, fire alarms in home, use of sunscreen).  Also reviewed age and gender appropriate health screening as well as vaccine recommendations. Vaccines:  Flu->given today.  Otherwise all UTD. Labs: fasting HP labs ordered. Prostate ca screening: average risk patient= as per latest guidelines, start screening at 20 yrs of age. Colon ca screening: Referred to GI 12/2018-->GI was unable to get in contact with pt.->new referral ordered today."  INTERIM HX: Doing well. Quit smoking 1 wk ago, started taking chantix. Says drinking alc went hand in hand with smoking so he has also stopped alcohol x 1 wk.  Denies w/drawal/cravings at this time.  Only checks home bp occasionally.  Last 2 days says home bp [id number] systolic. Says this is about usual for him with his cuff, always seems to read higher than ours here.  Denies dizziness, CP, palpitations, SOB, DOE, or changes in LE's. All labs stable last visit except triglycerides were moderately elevated->TLC recommended.  HLD: taking atorva 20 qd w/out problem. Says he has been eating less red meat since last visit, otherwise no signif dietary modifications.  ROS as above, plus--> no fevers, no  wheezing, no cough, no HAs, no rashes, no melena/hematochezia.  No polyuria or polydipsia.  No myalgias or arthralgias.  No focal weakness, paresthesias, or tremors.  No acute vision or hearing abnormalities.  No dysuria or unusual/new urinary urgency or frequency.  No n/v/d or abd pain.    Past Medical History:  Diagnosis Date   Arthritis    Elevated transaminase level    Viral Hep serologies neg 2020.  Abd u/s normal 11/2018.   Hyperlipidemia, mixed 2020/21   Great response to atorva 20   Hypertension    Polycythemia    genetic hemochromatosis test neg.  +Elevated ferritin and %sat.  + smoker.  Not on testost and no s/s of OSA.  Hematol eval 11/2018->reactive changes from alc abuse + tobacco suspected as cause, abd u/s normal.   Tobacco dependence    tobacco/alchol    Past Surgical History:  Procedure Laterality Date   COLONOSCOPY  03/21/2020   Multiple NONadenomatous polyps->recall 2032.   vasectomy     VASECTOMY REVERSAL      Outpatient Medications Prior to Visit  Medication Sig Dispense Refill   atorvastatin (LIPITOR) 20 MG tablet TAKE 1 TABLET BY MOUTH EVERY DAY 30 tablet 0   varenicline (CHANTIX) 1 MG tablet TAKE 1 TABLET BY MOUTH TWICE A DAY 180 tablet 0   hydrochlorothiazide (HYDRODIURIL) 25 MG tablet TAKE 1 TABLET BY MOUTH EVERY DAY 14 tablet 0   lisinopril (ZESTRIL) 10 MG tablet TAKE 2 TABLETS ('20MG'$ ) BY MOUTH EVERY DAY 180 tablet 3  No facility-administered medications prior to visit.    No Known Allergies  ROS As per HPI  PE: Vitals with BMI 10/16/2020 03/21/2020 03/21/2020  Height '5\' 5"'$  - -  Weight 152 lbs - -  BMI [id number] - -  Systolic 90 [id number] 98  Diastolic 50 70 69  Pulse 66 66 76   Gen: Alert, well appearing.  Patient is oriented to person, place, time, and situation. AFFECT: pleasant, lucid thought and speech. CV: RRR, no m/r/g.   LUNGS: CTA bilat, nonlabored resps, good aeration in all lung fields. EXT: no clubbing or cyanosis.  no edema.    LABS:   Lab Results  Component Value Date   TSH 2.11 01/14/2020   Lab Results  Component Value Date   WBC 6.9 01/14/2020   HGB 16.3 01/14/2020   HCT 47.7 01/14/2020   MCV 89.8 01/14/2020   PLT 254 01/14/2020   Lab Results  Component Value Date   IRON 103 11/23/2018   TIBC 296 11/23/2018   FERRITIN 417 (H) 11/23/2018   Lab Results  Component Value Date   CREATININE 0.92 01/14/2020   BUN 14 01/14/2020   NA 134 (L) 01/14/2020   K 4.1 01/14/2020   CL 97 (L) 01/14/2020   CO2 27 01/14/2020   Lab Results  Component Value Date   ALT 56 (H) 01/14/2020   AST 35 01/14/2020   ALKPHOS 68 11/23/2018   BILITOT 0.7 01/14/2020   Lab Results  Component Value Date   CHOL 183 01/14/2020   Lab Results  Component Value Date   HDL 61 01/14/2020   Lab Results  Component Value Date   LDLCALC 87 01/14/2020   Lab Results  Component Value Date   TRIG 293 (H) 01/14/2020   Lab Results  Component Value Date   CHOLHDL 3.0 01/14/2020   Lab Results  Component Value Date   HGBA1C 5.1 01/09/2019   IMPRESSION AND PLAN:  1) HTN: low to low normal bp.  Pt feels good. Cont lisin 20 qd and hctz 25 qd. Future lytes/cr ordered.  2) HLD: tolerating atorva 20 qd.  Trying to eat less fats/carbs. Future lipid panel and hepatic panel ordered.  3) Tobacco dependence: quit 1 wk ago, is on chantix, doing pretty well with this right now.    4) Polycythemia: hematology w/u ruled out polycythemia vera.  Mild ferritin elevation in the past felt to be acute phase reactant rather than suggestive of iron overload. Recheck cbc and iron panel ordered future.  An After Visit Summary was printed and given to the patient.  FOLLOW UP: Return in about 6 months (around 04/15/2021) for annual CPE (fasting).  Signed:  Crissie Sickles, MD           10/16/2020

## 2020-10-17 ENCOUNTER — Ambulatory Visit (INDEPENDENT_AMBULATORY_CARE_PROVIDER_SITE_OTHER): Payer: No Typology Code available for payment source

## 2020-10-17 DIAGNOSIS — I10 Essential (primary) hypertension: Secondary | ICD-10-CM

## 2020-10-17 DIAGNOSIS — E7800 Pure hypercholesterolemia, unspecified: Secondary | ICD-10-CM

## 2020-10-17 DIAGNOSIS — D751 Secondary polycythemia: Secondary | ICD-10-CM | POA: Diagnosis not present

## 2020-10-18 LAB — LIPID PANEL
Cholesterol: 160 mg/dL (ref <200)
HDL: 44 mg/dL (ref >=40)
LDL Cholesterol (Calc): 90 mg/dL (calc)
Non-HDL Cholesterol (Calc): 116 mg/dL (calc) (ref <130)
Total CHOL/HDL Ratio: 3.6 (calc) (ref <5.0)
Triglycerides: 160 mg/dL — ABNORMAL HIGH (ref <150)

## 2020-10-18 LAB — CBC WITH DIFFERENTIAL/PLATELET
Absolute Monocytes: 713 cells/uL (ref 200–950)
Basophils Absolute: 58 cells/uL (ref 0–200)
Basophils Relative: 0.8 %
Eosinophils Absolute: 259 cells/uL (ref 15–500)
Eosinophils Relative: 3.6 %
HCT: 50.7 % — ABNORMAL HIGH (ref 38.5–50.0)
Hemoglobin: 16.4 g/dL (ref 13.2–17.1)
Lymphs Abs: 1987 cells/uL (ref 850–3900)
MCH: 30.1 pg (ref 27.0–33.0)
MCHC: 32.3 g/dL (ref 32.0–36.0)
MCV: 93 fL (ref 80.0–100.0)
MPV: 11.4 fL (ref 7.5–12.5)
Monocytes Relative: 9.9 %
Neutro Abs: 4183 cells/uL (ref 1500–7800)
Neutrophils Relative %: 58.1 %
Platelets: 233 10*3/uL (ref 140–400)
RBC: 5.45 10*6/uL (ref 4.20–5.80)
RDW: 13.1 % (ref 11.0–15.0)
Total Lymphocyte: 27.6 %
WBC: 7.2 10*3/uL (ref 3.8–10.8)

## 2020-10-18 LAB — COMPREHENSIVE METABOLIC PANEL
AG Ratio: 2 (calc) (ref 1.0–2.5)
ALT: 43 U/L (ref 9–46)
AST: 29 U/L (ref 10–40)
Albumin: 5 g/dL (ref 3.6–5.1)
Alkaline phosphatase (APISO): 84 U/L (ref 36–130)
BUN: 15 mg/dL (ref 7–25)
CO2: 29 mmol/L (ref 20–32)
Calcium: 10 mg/dL (ref 8.6–10.3)
Chloride: 100 mmol/L (ref 98–110)
Creat: 0.82 mg/dL (ref 0.60–1.29)
Globulin: 2.5 g/dL (calc) (ref 1.9–3.7)
Glucose, Bld: 87 mg/dL (ref 65–99)
Potassium: 4.2 mmol/L (ref 3.5–5.3)
Sodium: 138 mmol/L (ref 135–146)
Total Bilirubin: 0.6 mg/dL (ref 0.2–1.2)
Total Protein: 7.5 g/dL (ref 6.1–8.1)

## 2020-10-18 LAB — TSH: TSH: 3.3 mIU/L (ref 0.40–4.50)

## 2020-10-18 LAB — IRON,TIBC AND FERRITIN PANEL
%SAT: 28 % (calc) (ref 20–48)
Ferritin: 273 ng/mL (ref 38–380)
Iron: 102 ug/dL (ref 50–180)
TIBC: 360 mcg/dL (calc) (ref 250–425)

## 2021-02-10 ENCOUNTER — Other Ambulatory Visit: Payer: Self-pay

## 2021-02-10 ENCOUNTER — Ambulatory Visit: Payer: No Typology Code available for payment source | Admitting: Family Medicine

## 2021-02-10 VITALS — BP 104/68 | HR 60 | Temp 97.6°F | Ht 65.0 in | Wt 158.2 lb

## 2021-02-10 DIAGNOSIS — M5412 Radiculopathy, cervical region: Secondary | ICD-10-CM | POA: Diagnosis not present

## 2021-02-10 DIAGNOSIS — G5601 Carpal tunnel syndrome, right upper limb: Secondary | ICD-10-CM

## 2021-02-10 DIAGNOSIS — R202 Paresthesia of skin: Secondary | ICD-10-CM | POA: Diagnosis not present

## 2021-02-10 NOTE — Progress Notes (Signed)
OFFICE VISIT  02/10/2021  CC:  Chief Complaint  Patient presents with   Tingling    Occurring in R elbow radiating down to fingertips, causing soreness that has been present for 2 months now.    HPI:    Patient is a 50 y.o. male who presents for tingling R arm/hand.  HPI: Patient reports approximately 30-month history of intermittent tingling sensation in right arm and hand.  Says it feels like a glove is over the hand going up to the elbow level.  Intermittently feels a discomfort in the medial right upper arm area.  All symptoms are triggered by moving head to the right and flexing neck as well.  Also symptoms triggered more when typing, which he does a lot at his job.  No neck injury or neck pain.  Some neck movements cause a tingling sensation over the right triceps as well.  Denies any weakness in arm or hand.  No radicular type pain. It bothers him a lot and cannot sleep much at night.  When he wakes up in the morning he does not have the symptoms.  No history of neck pain or radiculopathy in the past.  Review of Systems  Constitutional:  Negative for fatigue.  Musculoskeletal:  Positive for neck stiffness (mild). Negative for arthralgias, back pain, joint swelling, myalgias and neck pain.  Neurological:  Negative for dizziness, tremors and weakness.  Psychiatric/Behavioral:  Positive for sleep disturbance.     Past Medical History:  Diagnosis Date   Arthritis    Elevated transaminase level    Viral Hep serologies neg 2020.  Abd u/s normal 11/2018.   Hyperlipidemia, mixed 2020/21   Great response to atorva 20   Hypertension    Polycythemia    genetic hemochromatosis test neg.  +Elevated ferritin and %sat.  + smoker.  Not on testost and no s/s of OSA.  Hematol eval 11/2018->reactive changes from alc abuse + tobacco suspected as cause, abd u/s normal.   Tobacco dependence    tobacco/alchol    Past Surgical History:  Procedure Laterality Date   COLONOSCOPY  03/21/2020    Multiple NONadenomatous polyps->recall 2032.   vasectomy     VASECTOMY REVERSAL      Outpatient Medications Prior to Visit  Medication Sig Dispense Refill   atorvastatin (LIPITOR) 20 MG tablet TAKE 1 TABLET BY MOUTH EVERY DAY 30 tablet 0   hydrochlorothiazide (HYDRODIURIL) 25 MG tablet Take 1 tablet (25 mg total) by mouth daily. 90 tablet 3   lisinopril (ZESTRIL) 10 MG tablet TAKE 2 TABLETS (20MG ) BY MOUTH EVERY DAY 180 tablet 3   varenicline (CHANTIX) 1 MG tablet TAKE 1 TABLET BY MOUTH TWICE A DAY (Patient not taking: Reported on 02/10/2021) 180 tablet 0   No facility-administered medications prior to visit.    No Known Allergies  Review of Systems  Constitutional:  Negative for fatigue.  Musculoskeletal:  Positive for neck stiffness (mild). Negative for arthralgias, back pain, joint swelling, myalgias and neck pain.  Neurological:  Negative for dizziness, tremors and weakness.  Psychiatric/Behavioral:  Positive for sleep disturbance.   As per HPI  PE: Vitals with BMI 02/10/2021 10/16/2020 03/21/2020  Height 5\' 5"  5\' 5"  -  Weight 158 lbs 3 oz 152 lbs -  BMI 62.83 66.29 -  Systolic 476 90 546  Diastolic 68 50 70  Pulse 60 66 66     Physical Exam  Gen: Alert, well appearing.  Patient is oriented to person, place, time, and situation. AFFECT:  pleasant, lucid thought and speech. Neck: ROM fully intact, Spurling's elicits/reproduces R arm paresthesia Entire right arm without tenderness.  No swelling of elbow, wrist, or fingers. Tinel at elbow neg. Tinel at wrist neg. Phalens R wrist pos. Strength 5/5 prox/dist UE's bilat. DTRs: absent R triceps and brachioradialis.  Biceps 1+ bilat, L arm triceps and brachioradialis 1+. No muscle atrophy.  LABS:  Last CBC Lab Results  Component Value Date   WBC 7.2 10/17/2020   HGB 16.4 10/17/2020   HCT 50.7 (H) 10/17/2020   MCV 93.0 10/17/2020   MCH 30.1 10/17/2020   RDW 13.1 10/17/2020   PLT 233 10/17/2020   Lab Results   Component Value Date   IRON 102 10/17/2020   TIBC 360 10/17/2020   FERRITIN 273 10/17/2020  Transferrin sat 38%  Last metabolic panel Lab Results  Component Value Date   GLUCOSE 87 10/17/2020   NA 138 10/17/2020   K 4.2 10/17/2020   CL 100 10/17/2020   CO2 29 10/17/2020   BUN 15 10/17/2020   CREATININE 0.82 10/17/2020   GFRNONAA >60 11/23/2018   CALCIUM 10.0 10/17/2020   PROT 7.5 10/17/2020   ALBUMIN 4.5 11/23/2018   BILITOT 0.6 10/17/2020   ALKPHOS 68 11/23/2018   AST 29 10/17/2020   ALT 43 10/17/2020   ANIONGAP 9 11/23/2018   Last lipids Lab Results  Component Value Date   CHOL 160 10/17/2020   HDL 44 10/17/2020   LDLCALC 90 10/17/2020   LDLDIRECT 160.0 03/14/2019   TRIG 160 (H) 10/17/2020   CHOLHDL 3.6 10/17/2020   Last hemoglobin A1c Lab Results  Component Value Date   HGBA1C 5.1 01/09/2019   Last thyroid functions Lab Results  Component Value Date   TSH 3.30 10/17/2020   Hepatitis Latest Ref Rng & Units 10/03/2018  Hep B Surface Ag NON-REACTI NON-REACTIVE  Hep C Ab NON-REACTI NON-REACTIVE  Hep C Ab NON-REACTI NON-REACTIVE   Lab Results  Component Value Date   VITAMINB12 429 11/23/2018   IMPRESSION AND PLAN:  Right arm paresthesias--possible cervical radiculopathy.  CTS could also be contributing to R hand sx's. C-spine plain films and PT ordered. Wrist splint for R wrist discussed/recommended. Discussed possible trial of prednisone taper but pt declined at this time. If not improving in 6-8 wks then would refer to neurology for consideration of NCS/EMG +/- advanced imaging.  An After Visit Summary was printed and given to the patient.  FOLLOW UP: Return for 6-8 wks f/u radiculopathy.  Signed:  Crissie Sickles, MD           02/10/2021

## 2021-02-10 NOTE — Patient Instructions (Signed)
[facility] [address] [place],  [place]  [address] Main: [phone]

## 2021-02-19 ENCOUNTER — Ambulatory Visit: Payer: No Typology Code available for payment source | Admitting: Family Medicine

## 2021-03-24 ENCOUNTER — Other Ambulatory Visit: Payer: Self-pay

## 2021-03-25 ENCOUNTER — Ambulatory Visit: Payer: No Typology Code available for payment source | Admitting: Family Medicine

## 2021-03-25 ENCOUNTER — Encounter: Payer: Self-pay | Admitting: Family Medicine

## 2021-03-25 VITALS — BP 137/74 | HR 71 | Temp 97.9°F | Ht 65.0 in | Wt 159.2 lb

## 2021-03-25 DIAGNOSIS — Z91030 Bee allergy status: Secondary | ICD-10-CM

## 2021-03-25 DIAGNOSIS — M5412 Radiculopathy, cervical region: Secondary | ICD-10-CM

## 2021-03-25 MED ORDER — EPINEPHRINE 0.3 MG/0.3ML IJ SOAJ: 0.3000 mg | INTRAMUSCULAR | 1 refills | Status: AC | PRN

## 2021-03-25 MED ORDER — PREDNISONE 20 MG PO TABS: ORAL_TABLET | ORAL | 0 refills | Status: DC

## 2021-03-25 NOTE — Progress Notes (Signed)
OFFICE VISIT  03/25/2021  CC:  Chief Complaint  Patient presents with   Follow-up    Radiculopathy. Pt brought form to be completed from [facility] PT   HPI:    Patient is a 51 y.o. male who presents for 6 wk f/u right arm paresthesias. A/P as of last visit: "Right arm paresthesias--possible cervical radiculopathy.  CTS could also be contributing to R hand sx's. C-spine plain films and PT ordered. Wrist splint for R wrist discussed/recommended. Discussed possible trial of prednisone taper but pt declined at this time. If not improving in 6-8 wks then would refer to neurology for consideration of NCS/EMG +/- advanced imaging."  INTERIM HX: He did not get the c-spine x-rays. He reports being mild to moderately improved.  Certain motions do elicit the right arm numbness tingling from the elbow down.  The pain under his right upper arm is gone.  The neck is stiff but improving.  Summary from his physical therapy office reports that he is significantly improved, has made significant progress, good candidate for further PT  Reports past history of localized swelling with yellowjacket stings in the past.  A second episode of 1 sting resulted in significant swelling of the hand and arm.  He has afraid of progressive reaction next time he gets stung and asked for an EpiPen today.  No other known allergies.  Past Medical History:  Diagnosis Date   Arthritis    Elevated transaminase level    Viral Hep serologies neg 2020.  Abd u/s normal 11/2018.   Hyperlipidemia, mixed 2020/21   Great response to atorva 20   Hypertension    Polycythemia    genetic hemochromatosis test neg.  +Elevated ferritin and %sat.  + smoker.  Not on testost and no s/s of OSA.  Hematol eval 11/2018->reactive changes from alc abuse + tobacco suspected as cause, abd u/s normal.   Tobacco dependence    tobacco/alchol    Past Surgical History:  Procedure Laterality Date   COLONOSCOPY  03/21/2020   Multiple  NONadenomatous polyps->recall 2032.   vasectomy     VASECTOMY REVERSAL      Outpatient Medications Prior to Visit  Medication Sig Dispense Refill   atorvastatin (LIPITOR) 20 MG tablet TAKE 1 TABLET BY MOUTH EVERY DAY (Patient not taking: Reported on 03/25/2021) 30 tablet 0   hydrochlorothiazide (HYDRODIURIL) 25 MG tablet Take 1 tablet (25 mg total) by mouth daily. (Patient not taking: Reported on 03/25/2021) 90 tablet 3   lisinopril (ZESTRIL) 10 MG tablet TAKE 2 TABLETS (20MG ) BY MOUTH EVERY DAY (Patient not taking: Reported on 03/25/2021) 180 tablet 3   varenicline (CHANTIX) 1 MG tablet TAKE 1 TABLET BY MOUTH TWICE A DAY (Patient not taking: Reported on 02/10/2021) 180 tablet 0   No facility-administered medications prior to visit.    No Known Allergies  ROS As per HPI  PE: Vitals with BMI 03/25/2021 02/10/2021 10/16/2020  Height 5\' 5"  5\' 5"  5\' 5"   Weight 159 lbs 3 oz 158 lbs 3 oz 152 lbs  BMI 26.49 53.61 44.31  Systolic 540 086 90  Diastolic 74 68 50  Pulse 71 60 66   Physical Exam  Gen: Alert, well appearing.  Patient is oriented to person, place, time, and situation. AFFECT: pleasant, lucid thought and speech. Neck: ROM just a bit limited in all motions but mild/mod stiffness all motions. Nothing elicits R arm symptoms today. Strength and DTRs intact/symmetric. Tinel's and phalen's neg.  LABS:  Last metabolic panel Lab  Results  Component Value Date   GLUCOSE 87 10/17/2020   NA 138 10/17/2020   K 4.2 10/17/2020   CL 100 10/17/2020   CO2 29 10/17/2020   BUN 15 10/17/2020   CREATININE 0.82 10/17/2020   GFRNONAA >60 11/23/2018   CALCIUM 10.0 10/17/2020   PROT 7.5 10/17/2020   ALBUMIN 4.5 11/23/2018   BILITOT 0.6 10/17/2020   ALKPHOS 68 11/23/2018   AST 29 10/17/2020   ALT 43 10/17/2020   ANIONGAP 9 11/23/2018     IMPRESSION AND PLAN:  #1 cervical radiculopathy. He is improving with PT.  Trial of prednisone 40 mg a day x3 days, then 20 mg a day x3 days. Continue  with physical therapy and reevaluate in 6 to 8 weeks.  #2 history of yellowjacket stings, worsening reaction with each exposure. Discussed appropriate measures for future stings. EpiPen prescribed today.  An After Visit Summary was printed and given to the patient.  FOLLOW UP: Return for 6-8 week f/u neck/arm.  Signed:  Crissie Sickles, MD           03/25/2021

## 2021-04-08 ENCOUNTER — Other Ambulatory Visit: Payer: Self-pay | Admitting: Family Medicine

## 2021-04-10 MED ORDER — ATORVASTATIN CALCIUM 20 MG PO TABS: 20.0000 mg | ORAL_TABLET | Freq: Every day | ORAL | 0 refills | Status: DC

## 2021-04-22 ENCOUNTER — Encounter: Payer: Self-pay | Admitting: Family Medicine

## 2021-04-22 NOTE — Telephone Encounter (Signed)
I agree.  Preventative antibiotic is not needed. ?Reassure him that these are from viruses about 90% of the time, therefore even if he were to develop symptoms it is likely will resolve in 3 to 4 days without any antibacterial drops or ointment. ?

## 2021-05-03 ENCOUNTER — Other Ambulatory Visit: Payer: Self-pay | Admitting: Family Medicine

## 2021-06-02 ENCOUNTER — Other Ambulatory Visit: Payer: Self-pay | Admitting: Family Medicine

## 2021-07-04 ENCOUNTER — Other Ambulatory Visit: Payer: Self-pay | Admitting: Family Medicine

## 2021-09-19 ENCOUNTER — Other Ambulatory Visit: Payer: Self-pay | Admitting: Family Medicine

## 2021-10-11 ENCOUNTER — Other Ambulatory Visit: Payer: Self-pay | Admitting: Family Medicine

## 2021-10-29 ENCOUNTER — Other Ambulatory Visit: Payer: Self-pay | Admitting: Family Medicine

## 2021-11-22 ENCOUNTER — Other Ambulatory Visit: Payer: Self-pay | Admitting: Family Medicine

## 2021-12-11 ENCOUNTER — Other Ambulatory Visit: Payer: Self-pay | Admitting: Family Medicine

## 2021-12-23 ENCOUNTER — Other Ambulatory Visit: Payer: Self-pay | Admitting: Family Medicine

## 2021-12-30 ENCOUNTER — Other Ambulatory Visit: Payer: Self-pay | Admitting: Family Medicine

## 2022-05-20 ENCOUNTER — Other Ambulatory Visit: Payer: Self-pay | Admitting: Family Medicine

## 2022-05-20 ENCOUNTER — Ambulatory Visit: Payer: BLUE CROSS/BLUE SHIELD | Admitting: Family Medicine

## 2022-05-20 VITALS — BP 125/86 | HR 62 | Wt 162.2 lb

## 2022-05-20 DIAGNOSIS — D751 Secondary polycythemia: Secondary | ICD-10-CM | POA: Diagnosis not present

## 2022-05-20 DIAGNOSIS — Z0000 Encounter for general adult medical examination without abnormal findings: Secondary | ICD-10-CM | POA: Diagnosis not present

## 2022-05-20 DIAGNOSIS — Z125 Encounter for screening for malignant neoplasm of prostate: Secondary | ICD-10-CM

## 2022-05-20 DIAGNOSIS — E7800 Pure hypercholesterolemia, unspecified: Secondary | ICD-10-CM | POA: Diagnosis not present

## 2022-05-20 DIAGNOSIS — I10 Essential (primary) hypertension: Secondary | ICD-10-CM | POA: Diagnosis not present

## 2022-05-20 LAB — CBC
HCT: 47.5 % (ref 39.0–52.0)
Hemoglobin: 16.4 g/dL (ref 13.0–17.0)
MCHC: 34.5 g/dL (ref 30.0–36.0)
MCV: 90.1 fl (ref 78.0–100.0)
Platelets: 221 10*3/uL (ref 150.0–400.0)
RBC: 5.27 Mil/uL (ref 4.22–5.81)
RDW: 14.2 % (ref 11.5–15.5)
WBC: 7.7 10*3/uL (ref 4.0–10.5)

## 2022-05-20 LAB — COMPREHENSIVE METABOLIC PANEL
ALT: 30 U/L (ref 0–53)
AST: 23 U/L (ref 0–37)
Albumin: 4.6 g/dL (ref 3.5–5.2)
Alkaline Phosphatase: 85 U/L (ref 39–117)
BUN: 14 mg/dL (ref 6–23)
CO2: 26 mEq/L (ref 19–32)
Calcium: 9.5 mg/dL (ref 8.4–10.5)
Chloride: 102 mEq/L (ref 96–112)
Creatinine, Ser: 0.9 mg/dL (ref 0.40–1.50)
GFR: 100.09 mL/min (ref >60.00)
Glucose, Bld: 90 mg/dL (ref 70–99)
Potassium: 4.8 mEq/L (ref 3.5–5.1)
Sodium: 137 mEq/L (ref 135–145)
Total Bilirubin: 0.7 mg/dL (ref 0.2–1.2)
Total Protein: 7 g/dL (ref 6.0–8.3)

## 2022-05-20 LAB — TSH: TSH: 2.54 u[IU]/mL (ref 0.35–5.50)

## 2022-05-20 LAB — LIPID PANEL
Cholesterol: 228 mg/dL — ABNORMAL HIGH (ref 0–200)
HDL: 41.4 mg/dL (ref >39.00)
LDL Cholesterol: 148 mg/dL — ABNORMAL HIGH (ref 0–99)
NonHDL: 186.83
Total CHOL/HDL Ratio: 6
Triglycerides: 195 mg/dL — ABNORMAL HIGH (ref 0.0–149.0)
VLDL: 39 mg/dL (ref 0.0–40.0)

## 2022-05-20 LAB — PSA: PSA: 0.66 ng/mL (ref 0.10–4.00)

## 2022-05-20 MED ORDER — ATORVASTATIN CALCIUM 20 MG PO TABS: 20.0000 mg | ORAL_TABLET | Freq: Every day | ORAL | 3 refills | Status: DC

## 2022-05-20 MED ORDER — LISINOPRIL 10 MG PO TABS: ORAL_TABLET | ORAL | 3 refills | Status: DC

## 2022-05-20 MED ORDER — HYDROCHLOROTHIAZIDE 25 MG PO TABS: 25.0000 mg | ORAL_TABLET | Freq: Every day | ORAL | 3 refills | Status: DC

## 2022-05-20 NOTE — Patient Instructions (Signed)
Health Maintenance, Male Adopting a healthy lifestyle and getting preventive care are important in promoting health and wellness. Ask your health care provider about: The right schedule for you to have regular tests and exams. Things you can do on your own to prevent diseases and keep yourself healthy. What should I know about diet, weight, and exercise? Eat a healthy diet  Eat a diet that includes plenty of vegetables, fruits, low-fat dairy products, and lean protein. Do not eat a lot of foods that are high in solid fats, added sugars, or sodium. Maintain a healthy weight Body mass index (BMI) is a measurement that can be used to identify possible weight problems. It estimates body fat based on height and weight. Your health care provider can help determine your BMI and help you achieve or maintain a healthy weight. Get regular exercise Get regular exercise. This is one of the most important things you can do for your health. Most adults should: Exercise for at least 150 minutes each week. The exercise should increase your heart rate and make you sweat (moderate-intensity exercise). Do strengthening exercises at least twice a week. This is in addition to the moderate-intensity exercise. Spend less time sitting. Even light physical activity can be beneficial. Watch cholesterol and blood lipids Have your blood tested for lipids and cholesterol at 50 years of age, then have this test every 5 years. You may need to have your cholesterol levels checked more often if: Your lipid or cholesterol levels are high. You are older than 50 years of age. You are at high risk for heart disease. What should I know about cancer screening? Many types of cancers can be detected early and may often be prevented. Depending on your health history and family history, you may need to have cancer screening at various ages. This may include screening for: Colorectal cancer. Prostate cancer. Skin cancer. Lung  cancer. What should I know about heart disease, diabetes, and high blood pressure? Blood pressure and heart disease High blood pressure causes heart disease and increases the risk of stroke. This is more likely to develop in people who have high blood pressure readings or are overweight. Talk with your health care provider about your target blood pressure readings. Have your blood pressure checked: Every 3-5 years if you are 18-39 years of age. Every year if you are 40 years old or older. If you are between the ages of 65 and 75 and are a current or former smoker, ask your health care provider if you should have a one-time screening for abdominal aortic aneurysm (AAA). Diabetes Have regular diabetes screenings. This checks your fasting blood sugar level. Have the screening done: Once every three years after age 45 if you are at a normal weight and have a low risk for diabetes. More often and at a younger age if you are overweight or have a high risk for diabetes. What should I know about preventing infection? Hepatitis B If you have a higher risk for hepatitis B, you should be screened for this virus. Talk with your health care provider to find out if you are at risk for hepatitis B infection. Hepatitis C Blood testing is recommended for: Everyone born from 1945 through 1965. Anyone with known risk factors for hepatitis C. Sexually transmitted infections (STIs) You should be screened each year for STIs, including gonorrhea and chlamydia, if: You are sexually active and are younger than 50 years of age. You are older than 50 years of age and your   health care provider tells you that you are at risk for this type of infection. Your sexual activity has changed since you were last screened, and you are at increased risk for chlamydia or gonorrhea. Ask your health care provider if you are at risk. Ask your health care provider about whether you are at high risk for HIV. Your health care provider  may recommend a prescription medicine to help prevent HIV infection. If you choose to take medicine to prevent HIV, you should first get tested for HIV. You should then be tested every 3 months for as long as you are taking the medicine. Follow these instructions at home: Alcohol use Do not drink alcohol if your health care provider tells you not to drink. If you drink alcohol: Limit how much you have to 0-2 drinks a day. Know how much alcohol is in your drink. In the [place] one drink equals one 12 oz bottle of beer (355 mL), one 5 oz glass of wine (148 mL), or one 1 oz glass of hard liquor (44 mL). Lifestyle Do not use any products that contain nicotine or tobacco. These products include cigarettes, chewing tobacco, and vaping devices, such as e-cigarettes. If you need help quitting, ask your health care provider. Do not use street drugs. Do not share needles. Ask your health care provider for help if you need support or information about quitting drugs. General instructions Schedule regular health, dental, and eye exams. Stay current with your vaccines. Tell your health care provider if: You often feel depressed. You have ever been abused or do not feel safe at home. Summary Adopting a healthy lifestyle and getting preventive care are important in promoting health and wellness. Follow your health care provider's instructions about healthy diet, exercising, and getting tested or screened for diseases. Follow your health care provider's instructions on monitoring your cholesterol and blood pressure. This information is not intended to replace advice given to you by your health care provider. Make sure you discuss any questions you have with your health care provider. Document Revised: 06/23/2020 Document Reviewed: 06/23/2020 Elsevier Patient Education  2023 Elsevier Inc.  

## 2022-05-20 NOTE — Progress Notes (Signed)
Office Note 05/20/2022  CC:  Chief Complaint  Patient presents with   Medication Refill    Needs refills on meds and labs. No other questions or concerns.    HPI:  Patient is a 52 y.o. male who is here for annual health maintenance exam and follow-up hypertension, hyperlipidemia, and erythrocytosis.  Calvin Gomez is feeling well. He ran out of his cholesterol medicine about 3 or 4 months ago.  He did start smoking again.  He has Chantix at home to restart if he wants to.  Past Medical History:  Diagnosis Date   Arthritis    Elevated transaminase level    Viral Hep serologies neg 2020.  Abd u/s normal 11/2018.   Hyperlipidemia, mixed 2020/21   Great response to atorva 20   Hypertension    Polycythemia    genetic hemochromatosis test neg.  +Elevated ferritin and %sat.  + smoker.  Not on testost and no s/s of OSA.  Hematol eval 11/2018->reactive changes from alc abuse + tobacco suspected as cause, abd u/s normal.   Tobacco dependence    tobacco/alchol    Past Surgical History:  Procedure Laterality Date   COLONOSCOPY  03/21/2020   Multiple NONadenomatous polyps->recall 2032.   vasectomy     VASECTOMY REVERSAL      Family History  Problem Relation Age of Onset   Cancer Mother        Peritoneal Cancer   Hypertension Father    Stroke Father    Asthma Sister    Alcohol abuse Brother    Depression Brother    Drug abuse Brother    Early death Brother    Heart attack Maternal Grandmother    Stroke Maternal Grandmother    Stroke Maternal Grandfather    Heart disease Maternal Grandfather    Asthma Daughter    Colon cancer Neg Hx    Colon polyps Neg Hx    Esophageal cancer Neg Hx    Rectal cancer Neg Hx    Stomach cancer Neg Hx     Social History   Socioeconomic History   Marital status: Married    Spouse name: Not on file   Number of children: Not on file   Years of education: Not on file   Highest education level: Some college, no degree  Occupational History    Not on file  Tobacco Use   Smoking status: Every Day    Packs/day: 1.00    Years: 20.00    Additional pack years: 0.00    Total pack years: 20.00    Types: Cigarettes   Smokeless tobacco: Never  Vaping Use   Vaping Use: Former  Substance and Sexual Activity   Alcohol use: Yes    Alcohol/week: 14.0 standard drinks of alcohol    Types: 14 Shots of liquor per week    Comment: 3x week   Drug use: Never   Sexual activity: Not on file  Other Topics Concern   Not on file  Social History Narrative   Married, 5 kids.   Educ: some college   Occup: sales with [employer]   Tob: 21 pack-yr hx, current as of 12/2019.   Alc: about 2 liquor drinks per night.   No drugs.   Social Determinants of Health   Financial Resource Strain: Low Risk  (05/20/2022)   Overall Financial Resource Strain (CARDIA)    Difficulty of Paying Living Expenses: Not very hard  Food Insecurity: No Food Insecurity (05/20/2022)   Hunger Vital Sign  Worried About Charity fundraiser in the Last Year: Never true    [place] in the Last Year: Never true  Transportation Needs: No Transportation Needs (05/20/2022)   PRAPARE - Hydrologist (Medical): No    Lack of Transportation (Non-Medical): No  Physical Activity: Insufficiently Active (05/20/2022)   Exercise Vital Sign    Days of Exercise per Week: 1 day    Minutes of Exercise per Session: 30 min  Stress: No Stress Concern Present (05/20/2022)   [place]    Feeling of Stress : Not at all  Social Connections: Moderately Isolated (05/20/2022)   Social Connection and Isolation Panel [NHANES]    Frequency of Communication with Friends and Family: Three times a week    Frequency of Social Gatherings with Friends and Family: Once a week    Attends Religious Services: Never    Marine scientist or Organizations: No    Attends Music therapist: Not on file     Marital Status: Married  Human resources officer Violence: Not on file    Outpatient Medications Prior to Visit  Medication Sig Dispense Refill   atorvastatin (LIPITOR) 20 MG tablet TAKE 1 TABLET BY MOUTH EVERY DAY 15 tablet 0   EPINEPHrine 0.3 mg/0.3 mL IJ SOAJ injection Inject 0.3 mg into the muscle as needed for anaphylaxis. 2 each 1   hydrochlorothiazide (HYDRODIURIL) 25 MG tablet Take 1 tablet (25 mg total) by mouth daily. 90 tablet 3   lisinopril (ZESTRIL) 10 MG tablet TAKE 2 TABLETS (20MG ) BY MOUTH EVERY DAY 180 tablet 3   varenicline (CHANTIX) 1 MG tablet TAKE 1 TABLET BY MOUTH TWICE A DAY (Patient not taking: Reported on 05/20/2022) 56 tablet 3   predniSONE (DELTASONE) 20 MG tablet 2 tabs po qd x 3d, then 1 tab po qd x 3d 9 tablet 0   No facility-administered medications prior to visit.    No Known Allergies  Review of Systems  Constitutional:  Negative for appetite change, chills, fatigue and fever.  HENT:  Negative for congestion, dental problem, ear pain and sore throat.   Eyes:  Negative for discharge, redness and visual disturbance.  Respiratory:  Negative for cough, chest tightness, shortness of breath and wheezing.   Cardiovascular:  Negative for chest pain, palpitations and leg swelling.  Gastrointestinal:  Negative for abdominal pain, blood in stool, diarrhea, nausea and vomiting.  Genitourinary:  Negative for difficulty urinating, dysuria, flank pain, frequency, hematuria and urgency.  Musculoskeletal:  Negative for arthralgias, back pain, joint swelling, myalgias and neck stiffness.  Skin:  Negative for pallor and rash.  Neurological:  Negative for dizziness, speech difficulty, weakness and headaches.  Hematological:  Negative for adenopathy. Does not bruise/bleed easily.  Psychiatric/Behavioral:  Negative for confusion and sleep disturbance. The patient is not nervous/anxious.     PE;    05/20/2022   10:00 AM 03/25/2021    2:35 PM 02/10/2021   11:10 AM  Vitals with  BMI  Height  5\' 5"  5\' 5"   Weight 162 lbs 3 oz 159 lbs 3 oz 158 lbs 3 oz  BMI  [id number]  Systolic 0000000 [id number]  Diastolic 86 74 68  Pulse 62 71 60   Gen: Alert, well appearing.  Patient is oriented to person, place, time, and situation. AFFECT: pleasant, lucid thought and speech. ENT: Ears: EACs clear, normal epithelium.  TMs with good light reflex and  landmarks bilaterally.  Eyes: no injection, icteris, swelling, or exudate.  EOMI, PERRLA. Nose: no drainage or turbinate edema/swelling.  No injection or focal lesion.  Mouth: lips without lesion/swelling.  Oral mucosa pink and moist.  Dentition intact and without obvious caries or gingival swelling.  Oropharynx without erythema, exudate, or swelling.  Neck: supple/nontender.  No LAD, mass, or TM.  Carotid pulses 2+ bilaterally, without bruits. CV: RRR, no m/r/g.   LUNGS: CTA bilat, nonlabored resps, good aeration in all lung fields. ABD: soft, NT, ND, BS normal.  No hepatospenomegaly or mass.  No bruits. EXT: no clubbing, cyanosis, or edema.  Musculoskeletal: no joint swelling, erythema, warmth, or tenderness.  ROM of all joints intact. Skin - no sores or suspicious lesions or rashes or color changes  Pertinent labs:  Lab Results  Component Value Date   TSH 3.30 10/17/2020   Lab Results  Component Value Date   WBC 7.2 10/17/2020   HGB 16.4 10/17/2020   HCT 50.7 (H) 10/17/2020   MCV 93.0 10/17/2020   PLT 233 10/17/2020   Lab Results  Component Value Date   IRON 102 10/17/2020   TIBC 360 10/17/2020   FERRITIN 273 10/17/2020   Lab Results  Component Value Date   CREATININE 0.82 10/17/2020   BUN 15 10/17/2020   NA 138 10/17/2020   K 4.2 10/17/2020   CL 100 10/17/2020   CO2 29 10/17/2020   Lab Results  Component Value Date   ALT 43 10/17/2020   AST 29 10/17/2020   ALKPHOS 68 11/23/2018   BILITOT 0.6 10/17/2020   Lab Results  Component Value Date   CHOL 160 10/17/2020   Lab Results  Component Value Date   HDL 44  10/17/2020   Lab Results  Component Value Date   LDLCALC 90 10/17/2020   Lab Results  Component Value Date   TRIG 160 (H) 10/17/2020   Lab Results  Component Value Date   CHOLHDL 3.6 10/17/2020   Lab Results  Component Value Date   HGBA1C 5.1 01/09/2019   ASSESSMENT AND PLAN:   #1 health maintenance exam: Reviewed age and gender appropriate health maintenance issues (prudent diet, regular exercise, health risks of tobacco and excessive alcohol, use of seatbelts, fire alarms in home, use of sunscreen).  Also reviewed age and gender appropriate health screening as well as vaccine recommendations. Vaccines: UTD Labs: fasting HP, PSA Prostate ca screening: PSA today  Colon ca screening:  recall 2032  2.  Hypertension, well-controlled on HCTZ 25 mg a day and lisinopril 20 mg a day.  3.  Hyperlipidemia, mixed. Has done well on atorvastatin 20 mg a day.  He ran out of this medication several months ago. Lipid panel today, restart atorvastatin at 20 mg dose today.  4.  Erythrocytosis associated with tobacco dependence. Encouraged complete cessation.  He has done well on Chantix in the past and he has this at home to restart. CBC today.  An After Visit Summary was printed and given to the patient.  FOLLOW UP:  No follow-ups on file.  Signed:  Crissie Sickles, MD           05/20/2022

## 2022-05-20 NOTE — Addendum Note (Signed)
Addended by: Deveron Furlong D on: 05/20/2022 10:46 AM   Modules accepted: Orders

## 2022-05-21 NOTE — Telephone Encounter (Signed)
Please complete PA

## 2022-06-03 ENCOUNTER — Other Ambulatory Visit (HOSPITAL_COMMUNITY): Payer: Self-pay

## 2022-06-03 NOTE — Telephone Encounter (Signed)
Please send in for lisinopril 20 mg once a day, per test claim, a 30 day supply would be $0.62

## 2022-06-04 ENCOUNTER — Other Ambulatory Visit: Payer: Self-pay | Admitting: Family Medicine

## 2022-06-04 MED ORDER — LISINOPRIL 20 MG PO TABS: 20.0000 mg | ORAL_TABLET | Freq: Every day | ORAL | 11 refills | Status: DC

## 2022-06-04 NOTE — Telephone Encounter (Signed)
Please advise. See prior messages.

## 2022-11-19 ENCOUNTER — Encounter: Payer: Self-pay | Admitting: Family Medicine

## 2022-11-19 ENCOUNTER — Ambulatory Visit: Payer: BLUE CROSS/BLUE SHIELD | Admitting: Family Medicine

## 2022-11-19 VITALS — BP 129/79 | HR 78 | Temp 98.4°F | Ht 65.0 in | Wt 153.8 lb

## 2022-11-19 DIAGNOSIS — E7800 Pure hypercholesterolemia, unspecified: Secondary | ICD-10-CM

## 2022-11-19 DIAGNOSIS — I10 Essential (primary) hypertension: Secondary | ICD-10-CM

## 2022-11-19 NOTE — Progress Notes (Signed)
OFFICE VISIT  11/19/2022  CC:  Chief Complaint  Patient presents with   Medical Management of Chronic Issues    Pt is not fasting    Patient is a 52 y.o. male who presents for 6 mo f/u HTN, HLD, and tob dependence. A/P as of last visit: "1 health maintenance exam: Reviewed age and gender appropriate health maintenance issues (prudent diet, regular exercise, health risks of tobacco and excessive alcohol, use of seatbelts, fire alarms in home, use of sunscreen).  Also reviewed age and gender appropriate health screening as well as vaccine recommendations. Vaccines: UTD Labs: fasting HP, PSA Prostate ca screening: PSA today  Colon ca screening:  recall 2032   2.  Hypertension, well-controlled on HCTZ 25 mg a day and lisinopril 20 mg a day.   3.  Hyperlipidemia, mixed. Has done well on atorvastatin 20 mg a day.  He ran out of this medication several months ago. Lipid panel today, restart atorvastatin at 20 mg dose today.   4.  Erythrocytosis associated with tobacco dependence. Encouraged complete cessation.  He has done well on Chantix in the past and he has this at home to restart. CBC today."  INTERIM HX: Feeling well. Home bp 130s-140s usually. Working from home now, got a promotion at [employer]. Kids doing well, various sports.  Still smoking, not motivated to quit right now.  Past Medical History:  Diagnosis Date   Arthritis    Elevated transaminase level    Viral Hep serologies neg 2020.  Abd u/s normal 11/2018.   Hyperlipidemia, mixed 2020/21   Great response to atorva 20   Hypertension    Polycythemia    genetic hemochromatosis test neg.  +Elevated ferritin and %sat.  + smoker.  Not on testost and no s/s of OSA.  Hematol eval 11/2018->reactive changes from alc abuse + tobacco suspected as cause, abd u/s normal.   Tobacco dependence    tobacco/alchol    Past Surgical History:  Procedure Laterality Date   COLONOSCOPY  03/21/2020   Multiple NONadenomatous  polyps->recall 2032.   vasectomy     VASECTOMY REVERSAL     Outpatient Medications Prior to Visit  Medication Sig Dispense Refill   atorvastatin (LIPITOR) 20 MG tablet Take 1 tablet (20 mg total) by mouth daily. 90 tablet 3   hydrochlorothiazide (HYDRODIURIL) 25 MG tablet Take 1 tablet (25 mg total) by mouth daily. 90 tablet 3   lisinopril (ZESTRIL) 20 MG tablet Take 1 tablet (20 mg total) by mouth daily. 30 tablet 11   EPINEPHrine 0.3 mg/0.3 mL IJ SOAJ injection Inject 0.3 mg into the muscle as needed for anaphylaxis. (Patient not taking: Reported on 11/19/2022) 2 each 1   No facility-administered medications prior to visit.    No Known Allergies  Review of Systems As per HPI  PE:    11/19/2022    3:31 PM 05/20/2022   10:00 AM 03/25/2021    2:35 PM  Vitals with BMI  Height 5\' 5"   5\' 5"   Weight 153 lbs 13 oz 162 lbs 3 oz 159 lbs 3 oz  BMI 25.59  26.49  Systolic 129 125 433  Diastolic 79 86 74  Pulse 78 62 71     Physical Exam  Gen: Alert, well appearing.  Patient is oriented to person, place, time, and situation. AFFECT: pleasant, lucid thought and speech. No further exam today.  LABS:  Last CBC Lab Results  Component Value Date   WBC 7.7 05/20/2022   HGB 16.4  05/20/2022   HCT 47.5 05/20/2022   MCV 90.1 05/20/2022   MCH 30.1 10/17/2020   RDW 14.2 05/20/2022   PLT 221.0 05/20/2022   Last metabolic panel Lab Results  Component Value Date   GLUCOSE 90 05/20/2022   NA 137 05/20/2022   K 4.8 05/20/2022   CL 102 05/20/2022   CO2 26 05/20/2022   BUN 14 05/20/2022   CREATININE 0.90 05/20/2022   GFR 100.09 05/20/2022   CALCIUM 9.5 05/20/2022   PROT 7.0 05/20/2022   ALBUMIN 4.6 05/20/2022   BILITOT 0.7 05/20/2022   ALKPHOS 85 05/20/2022   AST 23 05/20/2022   ALT 30 05/20/2022   ANIONGAP 9 11/23/2018   Last lipids Lab Results  Component Value Date   CHOL 228 (H) 05/20/2022   HDL 41.40 05/20/2022   LDLCALC 148 (H) 05/20/2022   LDLDIRECT 160.0 03/14/2019    TRIG 195.0 (H) 05/20/2022   CHOLHDL 6 05/20/2022   Last hemoglobin A1c Lab Results  Component Value Date   HGBA1C 5.1 01/09/2019   Last thyroid functions Lab Results  Component Value Date   TSH 2.54 05/20/2022   Last vitamin B12 and Folate Lab Results  Component Value Date   VITAMINB12 429 11/23/2018   Lab Results  Component Value Date   PSA 0.66 05/20/2022   IMPRESSION AND PLAN:  #1 hypertension, well-controlled on lisinopril 20 mg a day.  #2 hypercholesterolemia, doing well on a atorvastatin 20 mg a day.  He will return for fasting lab work soon---> c-Met and lipid panel.  An After Visit Summary was printed and given to the patient.  FOLLOW UP: Return in about 6 months (around 05/20/2023) for annual CPE (fasting)--> also make fasting lab appointment at your earliest convenience.  Signed:  Santiago Bumpers, MD           11/19/2022

## 2022-12-06 ENCOUNTER — Other Ambulatory Visit: Payer: BLUE CROSS/BLUE SHIELD

## 2022-12-06 DIAGNOSIS — I10 Essential (primary) hypertension: Secondary | ICD-10-CM

## 2022-12-06 DIAGNOSIS — E7800 Pure hypercholesterolemia, unspecified: Secondary | ICD-10-CM

## 2022-12-07 LAB — COMPREHENSIVE METABOLIC PANEL WITH GFR
AG Ratio: 1.8 (calc) (ref 1.0–2.5)
ALT: 33 U/L (ref 9–46)
AST: 30 U/L (ref 10–35)
Albumin: 4.3 g/dL (ref 3.6–5.1)
Alkaline phosphatase (APISO): 93 U/L (ref 35–144)
BUN: 17 mg/dL (ref 7–25)
CO2: 26 mmol/L (ref 20–32)
Calcium: 9.2 mg/dL (ref 8.6–10.3)
Chloride: 104 mmol/L (ref 98–110)
Creat: 0.9 mg/dL (ref 0.70–1.30)
Globulin: 2.4 g/dL (ref 1.9–3.7)
Glucose, Bld: 93 mg/dL (ref 65–99)
Potassium: 4.6 mmol/L (ref 3.5–5.3)
Sodium: 138 mmol/L (ref 135–146)
Total Bilirubin: 0.7 mg/dL (ref 0.2–1.2)
Total Protein: 6.7 g/dL (ref 6.1–8.1)

## 2022-12-07 LAB — LIPID PANEL
Cholesterol: 145 mg/dL (ref <200)
HDL: 43 mg/dL (ref >=40)
LDL Cholesterol (Calc): 70 mg/dL
Non-HDL Cholesterol (Calc): 102 mg/dL (ref <130)
Total CHOL/HDL Ratio: 3.4 (calc) (ref <5.0)
Triglycerides: 220 mg/dL — ABNORMAL HIGH (ref <150)

## 2023-02-07 DIAGNOSIS — S8262XA Displaced fracture of lateral malleolus of left fibula, initial encounter for closed fracture: Secondary | ICD-10-CM | POA: Diagnosis not present

## 2023-02-11 ENCOUNTER — Ambulatory Visit: Payer: Self-pay | Admitting: Orthopedic Surgery

## 2023-02-11 ENCOUNTER — Other Ambulatory Visit: Payer: Self-pay | Admitting: Orthopedic Surgery

## 2023-02-11 DIAGNOSIS — M25572 Pain in left ankle and joints of left foot: Secondary | ICD-10-CM | POA: Diagnosis not present

## 2023-02-11 DIAGNOSIS — S8262XA Displaced fracture of lateral malleolus of left fibula, initial encounter for closed fracture: Secondary | ICD-10-CM | POA: Diagnosis not present

## 2023-02-14 ENCOUNTER — Encounter (HOSPITAL_BASED_OUTPATIENT_CLINIC_OR_DEPARTMENT_OTHER)
Admission: RE | Admit: 2023-02-14 | Discharge: 2023-02-14 | Disposition: A | Discharge status: Home / Self Care | Payer: BLUE CROSS/BLUE SHIELD | Source: Ambulatory Visit | Attending: Orthopedic Surgery | Admitting: Orthopedic Surgery

## 2023-02-14 ENCOUNTER — Encounter (HOSPITAL_BASED_OUTPATIENT_CLINIC_OR_DEPARTMENT_OTHER): Payer: Self-pay | Admitting: Orthopedic Surgery

## 2023-02-14 DIAGNOSIS — I10 Essential (primary) hypertension: Secondary | ICD-10-CM | POA: Diagnosis not present

## 2023-02-14 DIAGNOSIS — S8262XA Displaced fracture of lateral malleolus of left fibula, initial encounter for closed fracture: Secondary | ICD-10-CM | POA: Diagnosis present

## 2023-02-14 DIAGNOSIS — Z87891 Personal history of nicotine dependence: Secondary | ICD-10-CM | POA: Diagnosis not present

## 2023-02-14 DIAGNOSIS — Z01818 Encounter for other preprocedural examination: Secondary | ICD-10-CM | POA: Insufficient documentation

## 2023-02-14 DIAGNOSIS — W19XXXA Unspecified fall, initial encounter: Secondary | ICD-10-CM | POA: Diagnosis not present

## 2023-02-14 LAB — BASIC METABOLIC PANEL
Anion gap: 8 (ref 5–15)
BUN: 18 mg/dL (ref 6–20)
CO2: 26 mmol/L (ref 22–32)
Calcium: 9.8 mg/dL (ref 8.9–10.3)
Chloride: 100 mmol/L (ref 98–111)
Creatinine, Ser: 0.96 mg/dL (ref 0.61–1.24)
GFR, Estimated: >60 mL/min (ref >60)
Glucose, Bld: 94 mg/dL (ref 70–99)
Potassium: 4.3 mmol/L (ref 3.5–5.1)
Sodium: 134 mmol/L — ABNORMAL LOW (ref 135–145)

## 2023-02-14 NOTE — Progress Notes (Signed)

## 2023-02-17 ENCOUNTER — Other Ambulatory Visit: Payer: Self-pay

## 2023-02-17 ENCOUNTER — Ambulatory Visit (HOSPITAL_BASED_OUTPATIENT_CLINIC_OR_DEPARTMENT_OTHER): Payer: BLUE CROSS/BLUE SHIELD | Admitting: Anesthesiology

## 2023-02-17 ENCOUNTER — Ambulatory Visit (HOSPITAL_BASED_OUTPATIENT_CLINIC_OR_DEPARTMENT_OTHER)
Admission: RE | Admit: 2023-02-17 | Discharge: 2023-02-17 | Disposition: A | Discharge status: Home / Self Care | Payer: BLUE CROSS/BLUE SHIELD | Attending: Orthopedic Surgery | Admitting: Orthopedic Surgery

## 2023-02-17 ENCOUNTER — Encounter (HOSPITAL_BASED_OUTPATIENT_CLINIC_OR_DEPARTMENT_OTHER): Payer: Self-pay | Admitting: Orthopedic Surgery

## 2023-02-17 ENCOUNTER — Encounter (HOSPITAL_BASED_OUTPATIENT_CLINIC_OR_DEPARTMENT_OTHER)
Admission: RE | Disposition: A | Discharge status: Home / Self Care | Payer: Self-pay | Source: Home / Self Care | Attending: Orthopedic Surgery

## 2023-02-17 ENCOUNTER — Ambulatory Visit (HOSPITAL_BASED_OUTPATIENT_CLINIC_OR_DEPARTMENT_OTHER): Payer: BLUE CROSS/BLUE SHIELD

## 2023-02-17 DIAGNOSIS — S8262XA Displaced fracture of lateral malleolus of left fibula, initial encounter for closed fracture: Secondary | ICD-10-CM | POA: Insufficient documentation

## 2023-02-17 DIAGNOSIS — W19XXXA Unspecified fall, initial encounter: Secondary | ICD-10-CM | POA: Diagnosis not present

## 2023-02-17 DIAGNOSIS — Z87891 Personal history of nicotine dependence: Secondary | ICD-10-CM | POA: Diagnosis not present

## 2023-02-17 DIAGNOSIS — I10 Essential (primary) hypertension: Secondary | ICD-10-CM | POA: Diagnosis not present

## 2023-02-17 DIAGNOSIS — G8918 Other acute postprocedural pain: Secondary | ICD-10-CM | POA: Diagnosis not present

## 2023-02-17 DIAGNOSIS — Z01818 Encounter for other preprocedural examination: Secondary | ICD-10-CM

## 2023-02-17 DIAGNOSIS — S82409A Unspecified fracture of shaft of unspecified fibula, initial encounter for closed fracture: Secondary | ICD-10-CM | POA: Insufficient documentation

## 2023-02-17 HISTORY — PX: ORIF ANKLE FRACTURE: SHX5408

## 2023-02-17 SURGERY — OPEN REDUCTION INTERNAL FIXATION (ORIF) ANKLE FRACTURE
Anesthesia: Regional | Site: Ankle | Laterality: Left

## 2023-02-17 MED ORDER — PROPOFOL 10 MG/ML IV BOLUS
INTRAVENOUS | Status: AC
  Filled 2023-02-17: qty 20

## 2023-02-17 MED ORDER — SODIUM CHLORIDE 0.9 % IV SOLN: INTRAVENOUS | Status: DC | PRN

## 2023-02-17 MED ORDER — FENTANYL CITRATE (PF) 100 MCG/2ML IJ SOLN
100.0000 ug | Freq: Once | INTRAMUSCULAR | Status: AC
  Administered 2023-02-17: 50 ug via INTRAVENOUS

## 2023-02-17 MED ORDER — PROPOFOL 10 MG/ML IV BOLUS
INTRAVENOUS | Status: DC | PRN
  Administered 2023-02-17: 200 mg via INTRAVENOUS

## 2023-02-17 MED ORDER — LACTATED RINGERS IV SOLN
INTRAVENOUS | Status: DC
Start: 2023-02-17 — End: 2023-02-17

## 2023-02-17 MED ORDER — MIDAZOLAM HCL 2 MG/2ML IJ SOLN
INTRAMUSCULAR | Status: AC
  Filled 2023-02-17: qty 2

## 2023-02-17 MED ORDER — ACETAMINOPHEN 500 MG PO TABS
ORAL_TABLET | ORAL | Status: AC
  Filled 2023-02-17: qty 2

## 2023-02-17 MED ORDER — ONDANSETRON HCL 4 MG/2ML IJ SOLN
INTRAMUSCULAR | Status: AC
  Filled 2023-02-17: qty 2

## 2023-02-17 MED ORDER — ONDANSETRON HCL 4 MG/2ML IJ SOLN
INTRAMUSCULAR | Status: DC | PRN
  Administered 2023-02-17: 4 mg via INTRAVENOUS

## 2023-02-17 MED ORDER — DEXAMETHASONE SODIUM PHOSPHATE 10 MG/ML IJ SOLN
INTRAMUSCULAR | Status: DC | PRN
  Administered 2023-02-17 (×2): 10 mg via INTRAVENOUS

## 2023-02-17 MED ORDER — CEFAZOLIN SODIUM-DEXTROSE 2-4 GM/100ML-% IV SOLN
2.0000 g | INTRAVENOUS | Status: AC
  Administered 2023-02-17: 2 g via INTRAVENOUS

## 2023-02-17 MED ORDER — MIDAZOLAM HCL 2 MG/2ML IJ SOLN
2.0000 mg | Freq: Once | INTRAMUSCULAR | Status: AC
  Administered 2023-02-17: 2 mg via INTRAVENOUS

## 2023-02-17 MED ORDER — ACETAMINOPHEN 500 MG PO TABS
1000.0000 mg | ORAL_TABLET | Freq: Once | ORAL | Status: AC
Start: 2023-02-17 — End: 2023-02-17
  Administered 2023-02-17: 1000 mg via ORAL

## 2023-02-17 MED ORDER — 0.9 % SODIUM CHLORIDE (POUR BTL) OPTIME
TOPICAL | Status: DC | PRN
  Administered 2023-02-17: 300 mL

## 2023-02-17 MED ORDER — KCL IN DEXTROSE-NACL 20-5-0.45 MEQ/L-%-% IV SOLN: INTRAVENOUS | Status: DC

## 2023-02-17 MED ORDER — EPHEDRINE SULFATE (PRESSORS) 50 MG/ML IJ SOLN
INTRAMUSCULAR | Status: DC | PRN
  Administered 2023-02-17: 10 mg via INTRAVENOUS

## 2023-02-17 MED ORDER — DEXMEDETOMIDINE HCL IN NACL 80 MCG/20ML IV SOLN
INTRAVENOUS | Status: DC | PRN
Start: 2023-02-17 — End: 2023-02-17
  Administered 2023-02-17: 8 ug via INTRAVENOUS

## 2023-02-17 MED ORDER — DEXAMETHASONE SODIUM PHOSPHATE 10 MG/ML IJ SOLN
INTRAMUSCULAR | Status: AC
  Filled 2023-02-17: qty 1

## 2023-02-17 MED ORDER — FENTANYL CITRATE (PF) 100 MCG/2ML IJ SOLN
INTRAMUSCULAR | Status: AC
  Filled 2023-02-17: qty 2

## 2023-02-17 MED ORDER — CEFAZOLIN SODIUM-DEXTROSE 2-4 GM/100ML-% IV SOLN
INTRAVENOUS | Status: AC
  Filled 2023-02-17: qty 100

## 2023-02-17 MED ORDER — FENTANYL CITRATE (PF) 100 MCG/2ML IJ SOLN
25.0000 ug | INTRAMUSCULAR | Status: DC | PRN
Start: 2023-02-17 — End: 2023-02-17

## 2023-02-17 MED ORDER — OXYCODONE HCL 5 MG PO TABS: 5.0000 mg | ORAL_TABLET | Freq: Four times a day (QID) | ORAL | 0 refills | Status: AC | PRN

## 2023-02-17 SURGICAL SUPPLY — 57 items
BANDAGE ESMARK 6X9 LF (GAUZE/BANDAGES/DRESSINGS) IMPLANT
BIT DRILL 2.5X2.75 QC CALB (BIT) IMPLANT
BIT DRILL 3.5X5.5 QC CALB (BIT) IMPLANT
BLADE SURG 15 STRL LF DISP TIS (BLADE) ×2 IMPLANT
BNDG ELASTIC 4INX 5YD STR LF (GAUZE/BANDAGES/DRESSINGS) ×1 IMPLANT
BNDG ELASTIC 6INX 5YD STR LF (GAUZE/BANDAGES/DRESSINGS) ×1 IMPLANT
BNDG ESMARK 4X9 LF (GAUZE/BANDAGES/DRESSINGS) IMPLANT
BNDG ESMARK 6X9 LF (GAUZE/BANDAGES/DRESSINGS) IMPLANT
CANISTER SUCT 1200ML W/VALVE (MISCELLANEOUS) ×1 IMPLANT
CHLORAPREP W/TINT 26 (MISCELLANEOUS) ×1 IMPLANT
COVER BACK TABLE 60X90IN (DRAPES) ×1 IMPLANT
CUFF TRNQT CYL 34X4.125X (TOURNIQUET CUFF) IMPLANT
DRAPE EXTREMITY T 121X128X90 (DISPOSABLE) ×1 IMPLANT
DRAPE OEC MINIVIEW 54X84 (DRAPES) ×1 IMPLANT
DRAPE U-SHAPE 47X51 STRL (DRAPES) ×1 IMPLANT
DRSG MEPITEL 4X7.2 (GAUZE/BANDAGES/DRESSINGS) ×1 IMPLANT
ELECT REM PT RETURN 9FT ADLT (ELECTROSURGICAL) ×1 IMPLANT
ELECTRODE REM PT RTRN 9FT ADLT (ELECTROSURGICAL) ×1 IMPLANT
GAUZE PAD ABD 8X10 STRL (GAUZE/BANDAGES/DRESSINGS) ×2 IMPLANT
GAUZE SPONGE 4X4 12PLY STRL (GAUZE/BANDAGES/DRESSINGS) ×1 IMPLANT
GLOVE BIO SURGEON STRL SZ8 (GLOVE) ×1 IMPLANT
GLOVE BIOGEL PI IND STRL 8 (GLOVE) ×2 IMPLANT
GLOVE ECLIPSE 8.0 STRL XLNG CF (GLOVE) ×1 IMPLANT
GOWN STRL REUS W/ TWL LRG LVL3 (GOWN DISPOSABLE) ×1 IMPLANT
GOWN STRL REUS W/ TWL XL LVL3 (GOWN DISPOSABLE) ×2 IMPLANT
NDL HYPO 22X1.5 SAFETY MO (MISCELLANEOUS) IMPLANT
NEEDLE HYPO 22X1.5 SAFETY MO (MISCELLANEOUS) IMPLANT
NS IRRIG 1000ML POUR BTL (IV SOLUTION) ×1 IMPLANT
PACK BASIN DAY SURGERY FS (CUSTOM PROCEDURE TRAY) ×1 IMPLANT
PAD CAST 4YDX4 CTTN HI CHSV (CAST SUPPLIES) ×1 IMPLANT
PADDING CAST ABS COTTON 4X4 ST (CAST SUPPLIES) IMPLANT
PADDING CAST COTTON 6X4 STRL (CAST SUPPLIES) ×1 IMPLANT
PENCIL SMOKE EVACUATOR (MISCELLANEOUS) ×1 IMPLANT
PLATE ACE 100DEG 6HOLE (Plate) IMPLANT
SANITIZER HAND PURELL FF 515ML (MISCELLANEOUS) ×1 IMPLANT
SCREW CORTICAL 3.5MM 14MM (Screw) IMPLANT
SCREW CORTICAL 3.5MM 16MM (Screw) IMPLANT
SCREW CORTICAL 3.5MM 18MM (Screw) IMPLANT
SCREW CORTICAL 3.5MM 26MM (Screw) IMPLANT
SHEET MEDIUM DRAPE 40X70 STRL (DRAPES) ×1 IMPLANT
SLEEVE SCD COMPRESS KNEE MED (STOCKING) ×1 IMPLANT
SPIKE FLUID TRANSFER (MISCELLANEOUS) IMPLANT
SPLINT PLASTER CAST FAST 5X30 (CAST SUPPLIES) ×20 IMPLANT
SPONGE T-LAP 18X18 ~~LOC~~+RFID (SPONGE) ×1 IMPLANT
STOCKINETTE 6 STRL (DRAPES) ×1 IMPLANT
SUCTION TUBE FRAZIER 10FR DISP (SUCTIONS) ×1 IMPLANT
SUT ETHILON 3 0 PS 1 (SUTURE) ×1 IMPLANT
SUT FIBERWIRE #2 38 T-5 BLUE (SUTURE) IMPLANT
SUT MNCRL AB 3-0 PS2 18 (SUTURE) IMPLANT
SUT VIC AB 2-0 SH 27XBRD (SUTURE) ×1 IMPLANT
SUT VICRYL 0 SH 27 (SUTURE) IMPLANT
SUTURE FIBERWR #2 38 T-5 BLUE (SUTURE) IMPLANT
SYR BULB EAR ULCER 3OZ GRN STR (SYRINGE) ×1 IMPLANT
SYR CONTROL 10ML LL (SYRINGE) IMPLANT
TOWEL GREEN STERILE FF (TOWEL DISPOSABLE) ×2 IMPLANT
TUBE CONNECTING 20X1/4 (TUBING) ×1 IMPLANT
UNDERPAD 30X36 HEAVY ABSORB (UNDERPADS AND DIAPERS) ×1 IMPLANT

## 2023-02-17 NOTE — Op Note (Signed)
 02/17/2023  1:37 PM  PATIENT:  Calvin Gomez  53 y.o. male  PRE-OPERATIVE DIAGNOSIS:  Closed fracture of lateral malleolus of left fibula  POST-OPERATIVE DIAGNOSIS:  Closed fracture of lateral malleolus of left fibula  Procedure(s): 1.  Open treatment left ankle lateral malleolus fracture with internal fixation 2.  Stress examination of the left ankle under fluoroscopy 3.  AP, mortise and lateral radiographs of the left ankle  SURGEON:  Norleen Armor, MD  ASSISTANT: None  ANESTHESIA:   General, regional  EBL:  minimal   TOURNIQUET:   Total Tourniquet Time Documented: Thigh (Left) - 19 minutes Total: Thigh (Left) - 19 minutes  COMPLICATIONS:  None apparent  DISPOSITION:  Extubated, awake and stable to recovery.  INDICATION FOR PROCEDURE: 53 year old male with a past medical history significant for smoking fell while running on gravel and broke his ankle last week.  Radiographs reveal a displaced lateral malleolus fracture.  He presents now for operative treatment of this displaced and unstable left ankle injury.  The risks and benefits of the alternative treatment options have been discussed in detail.  The patient wishes to proceed with surgery and specifically understands risks of bleeding, infection, nerve damage, blood clots, need for additional surgery, amputation and death.    PROCEDURE IN DETAIL:  After pre operative consent was obtained, and the correct operative site was identified, the patient was brought to the operating room and placed supine on the OR table.  Anesthesia was administered.  Pre-operative antibiotics were administered.  A surgical timeout was taken.  The left lower extremity was prepped and draped in standard sterile fashion with a tourniquet around the thigh.  The extremity was elevated, and the tourniquet was inflated to 250 mmHg.  A longitudinal incision was made over the lateral malleolus.  Dissection was carried sharply down through the subcutaneous tissues  to the fracture site.  The fracture was opened and cleaned of all hematoma.  It was irrigated.  The fracture was reduced and held with a lobster claw clamp.  A 3.5 mm fully threaded lag screw from the [employer] small frag set was then inserted across the fracture line.  It was tightened securely and was noted to have adequate purchase.  It compressed the fracture site appropriately.  A 6-hole one third tubular plate was then contoured to fit the lateral malleolus.  It was secured proximally with 3 bicortical screws and distally with 2 unicortical screws.  AP, mortise and lateral radiographs confirmed appropriate reduction of the fracture and appropriate position and length of the hardware.  Stress examination was performed.  Dorsiflexion and external rotation stress was applied to the supinated forefoot under live fluoroscopy.  There was no widening of the ankle mortise or decrease in the distal tib-fib overlap.  The wound was irrigated copiously and sprinkled with vancomycin powder.  Subcutaneous tissues were approximated with Monocryl.  The skin incision was closed with nylon.  Sterile dressings were applied followed by a well-padded short leg splint.  The tourniquet was released after application of the dressings.  The patient was awakened from anesthesia and transported to the recovery room in stable condition.   FOLLOW UP PLAN: Nonweightbearing on the left lower extremity.  Follow-up in the office in 2 weeks for suture removal and conversion to a cam boot for early weightbearing and range of motion.  Aspirin for DVT prophylaxis.   RADIOGRAPHS: AP, mortise and lateral radiographs of the left ankle are obtained intraoperatively.  These show interval reduction and fixation  of the left ankle lateral malleolus fracture.  Hardware is appropriately positioned and of the appropriate lengths.  No other acute injuries are noted.

## 2023-02-17 NOTE — Anesthesia Procedure Notes (Signed)
 Procedure Name: LMA Insertion Date/Time: 02/17/2023 12:54 PM  Performed by: Julieanne Fairy BROCKS, CRNAPre-anesthesia Checklist: Patient identified, Emergency Drugs available, Suction available and Patient being monitored Patient Re-evaluated:Patient Re-evaluated prior to induction Oxygen Delivery Method: Circle system utilized Preoxygenation: Pre-oxygenation with 100% oxygen Induction Type: IV induction Ventilation: Mask ventilation without difficulty LMA: LMA inserted LMA Size: 4.0 Number of attempts: 1 Airway Equipment and Method: Bite block Placement Confirmation: positive ETCO2 Tube secured with: Tape Dental Injury: Teeth and Oropharynx as per pre-operative assessment

## 2023-02-17 NOTE — Discharge Instructions (Addendum)
 Norleen Armor, MD [facility]  Please read the following information regarding your care after surgery.  Medications  You only need a prescription for the narcotic pain medicine (ex. oxycodone , Percocet, Norco).  All of the other medicines listed below are available over the counter. X Aleve 2 pills twice a day for the first 3 days after surgery. X acetominophen (Tylenol ) 650 mg every 4-6 hours as you need for minor to moderate pain X oxycodone  as prescribed for severe pain  Narcotic pain medicine (ex. oxycodone , Percocet, Vicodin) will cause constipation.  To prevent this problem, take the following medicines while you are taking any pain medicine. X docusate sodium (Colace) 100 mg twice a day X senna (Senokot) 2 tablets twice a day  X To help prevent blood clots, take a baby aspirin (81 mg) twice a day for two weeks after surgery.  You should also get up every hour while you are awake to move around.    Weight Bearing X Do not bear any weight on the operated leg or foot.  Cast / Splint / Dressing X Keep your splint, cast or dressing clean and dry.  Don't put anything (coat hanger, pencil, etc) down inside of it.  If it gets damp, use a hair dryer on the cool setting to dry it.  If it gets soaked, call the office to schedule an appointment for a cast change.  After your dressing, cast or splint is removed; you may shower, but do not soak or scrub the wound.  Allow the water to run over it, and then gently pat it dry.  Swelling It is normal for you to have swelling where you had surgery.  To reduce swelling and pain, keep your toes above your nose for at least 3 days after surgery.  It may be necessary to keep your foot or leg elevated for several weeks.  If it hurts, it should be elevated.  Follow Up Call my office at [phone] when you are discharged from the hospital or surgery center to schedule an appointment to be seen two weeks after surgery.  Call my office at [phone] if  you develop a fever >101.5 F, nausea, vomiting, bleeding from the surgical site or severe pain.   Post Anesthesia Home Care Instructions  Activity: Get plenty of rest for the remainder of the day. A responsible individual must stay with you for 24 hours following the procedure.  For the next 24 hours, DO NOT: -Drive a car -Advertising copywriter -Drink alcoholic beverages -Take any medication unless instructed by your physician -Make any legal decisions or sign important papers.  Meals: Start with liquid foods such as gelatin or soup. Progress to regular foods as tolerated. Avoid greasy, spicy, heavy foods. If nausea and/or vomiting occur, drink only clear liquids until the nausea and/or vomiting subsides. Call your physician if vomiting continues.  Special Instructions/Symptoms: Your throat may feel dry or sore from the anesthesia or the breathing tube placed in your throat during surgery. If this causes discomfort, gargle with warm salt water. The discomfort should disappear within 24 hours.  If you had a scopolamine patch placed behind your ear for the management of post- operative nausea and/or vomiting:  1. The medication in the patch is effective for 72 hours, after which it should be removed.  Wrap patch in a tissue and discard in the trash. Wash hands thoroughly with soap and water. 2. You may remove the patch earlier than 72 hours if you experience unpleasant side effects  which may include dry mouth, dizziness or visual disturbances. 3. Avoid touching the patch. Wash your hands with soap and water after contact with the patch.  Regional Anesthesia Blocks  1. You may not be able to move or feel the blocked extremity after a regional anesthetic block. This may last may last from 3-48 hours after placement, but it will go away. The length of time depends on the medication injected and your individual response to the medication. As the nerves start to wake up, you may experience  tingling as the movement and feeling returns to your extremity. If the numbness and inability to move your extremity has not gone away after 48 hours, please call your surgeon.   2. The extremity that is blocked will need to be protected until the numbness is gone and the strength has returned. Because you cannot feel it, you will need to take extra care to avoid injury. Because it may be weak, you may have difficulty moving it or using it. You may not know what position it is in without looking at it while the block is in effect.  3. For blocks in the legs and feet, returning to weight bearing and walking needs to be done carefully. You will need to wait until the numbness is entirely gone and the strength has returned. You should be able to move your leg and foot normally before you try and bear weight or walk. You will need someone to be with you when you first try to ensure you do not fall and possibly risk injury.  4. Bruising and tenderness at the needle site are common side effects and will resolve in a few days.  5. Persistent numbness or new problems with movement should be communicated to the surgeon or the [facility] [phone] [facility] [phone]).  Information for Discharge Teaching: EXPAREL  (bupivacaine  liposome injectable suspension)   Pain relief is important to your recovery. The goal is to control your pain so you can move easier and return to your normal activities as soon as possible after your procedure. Your physician may use several types of medicines to manage pain, swelling, and more.  Your surgeon or anesthesiologist gave you EXPAREL (bupivacaine ) to help control your pain after surgery.  EXPAREL  is a local anesthetic designed to release slowly over an extended period of time to provide pain relief by numbing the tissue around the surgical site. EXPAREL  is designed to release pain medication over time and can control pain for up to 72  hours. Depending on how you respond to EXPAREL , you may require less pain medication during your recovery. EXPAREL  can help reduce or eliminate the need for opioids during the first few days after surgery when pain relief is needed the most. EXPAREL  is not an opioid and is not addictive. It does not cause sleepiness or sedation.   Important! A teal colored band has been placed on your arm with the date, time and amount of EXPAREL  you have received. Please leave this armband in place for the full 96 hours following administration, and then you may remove the band. If you return to the hospital for any reason within 96 hours following the administration of EXPAREL , the armband provides important information that your health care providers to know, and alerts them that you have received this anesthetic.    Possible side effects of EXPAREL : Temporary loss of sensation or ability to move in the area where medication was injected. Nausea, vomiting, constipation  Rarely, numbness and tingling in your mouth or lips, lightheadedness, or anxiety may occur. Call your doctor right away if you think you may be experiencing any of these sensations, or if you have other questions regarding possible side effects.  Follow all other discharge instructions given to you by your surgeon or nurse. Eat a healthy diet and drink plenty of water or other fluids.  No tylenol  until after 5:30pm if needed today.

## 2023-02-17 NOTE — Anesthesia Preprocedure Evaluation (Addendum)
 Anesthesia Evaluation  Patient identified by MRN, date of birth, ID band Patient awake    Reviewed: Allergy & Precautions, NPO status , Patient's Chart, lab work & pertinent test results  Airway Mallampati: II  TM Distance: >3 FB Neck ROM: Full    Dental no notable dental hx. (+) Teeth Intact, Dental Advisory Given   Pulmonary former smoker   Pulmonary exam normal breath sounds clear to auscultation       Cardiovascular hypertension, Pt. on medications Normal cardiovascular exam Rhythm:Regular Rate:Normal     Neuro/Psych negative neurological ROS  negative psych ROS   GI/Hepatic negative GI ROS, Neg liver ROS,,,  Endo/Other  negative endocrine ROS    Renal/GU negative Renal ROS  negative genitourinary   Musculoskeletal  (+) Arthritis ,    Abdominal   Peds  Hematology negative hematology ROS (+)   Anesthesia Other Findings   Reproductive/Obstetrics                             Anesthesia Physical Anesthesia Plan  ASA: 2  Anesthesia Plan: General and Regional   Post-op Pain Management: Regional block* and Tylenol  PO (pre-op)*   Induction: Intravenous  PONV Risk Score and Plan: 2 and Ondansetron , Dexamethasone  and Midazolam   Airway Management Planned: LMA  Additional Equipment:   Intra-op Plan:   Post-operative Plan: Extubation in OR  Informed Consent: I have reviewed the patients History and Physical, chart, labs and discussed the procedure including the risks, benefits and alternatives for the proposed anesthesia with the patient or authorized representative who has indicated his/her understanding and acceptance.     Dental advisory given  Plan Discussed with: CRNA  Anesthesia Plan Comments:        Anesthesia Quick Evaluation

## 2023-02-17 NOTE — Transfer of Care (Signed)
 Immediate Anesthesia Transfer of Care Note  Patient: Calvin Gomez  Procedure(s) Performed: OPEN REDUCTION INTERNAL FIXATION (ORIF) lateral malleolus ANKLE FRACTURE (Left: Ankle)  Patient Location: PACU  Anesthesia Type:GA combined with regional for post-op pain  Level of Consciousness: sedated  Airway & Oxygen Therapy: Patient Spontanous Breathing and Patient connected to face mask oxygen  Post-op Assessment: Report given to RN and Post -op Vital signs reviewed and stable  Post vital signs: Reviewed and stable  Last Vitals:  Vitals Value Taken Time  BP 91/43 02/17/23 1338  Temp    Pulse 73 02/17/23 1341  Resp 15 02/17/23 1341  SpO2 97 % 02/17/23 1341  Vitals shown include unfiled device data.  Last Pain:  Vitals:   02/17/23 1132  TempSrc: Temporal  PainSc: 0-No pain         Complications: No notable events documented.

## 2023-02-17 NOTE — Progress Notes (Signed)
Assisted Dr. Woodrum with left, adductor canal, popliteal, ultrasound guided block. Side rails up, monitors on throughout procedure. See vital signs in flow sheet. Tolerated Procedure well. 

## 2023-02-17 NOTE — H&P (Signed)
 Calvin Gomez is an 53 y.o. male.   Chief Complaint: Left ankle pain HPI: 53 year old male with a past medical history significant for smoking injured his ankle a few days ago when he twisted his ankle running in some gravel.  He has a displaced lateral malleolus fracture and presents today for surgical treatment.  Past Medical History:  Diagnosis Date   Arthritis    Elevated transaminase level    Viral Hep serologies neg 2020.  Abd u/s normal 11/2018.   Hyperlipidemia, mixed 2020/21   Great response to atorva 20   Hypertension    Polycythemia    genetic hemochromatosis test neg.  +Elevated ferritin and %sat.  + smoker.  Not on testost and no s/s of OSA.  Hematol eval 11/2018->reactive changes from alc abuse + tobacco suspected as cause, abd u/s normal.   Tobacco dependence    tobacco/alchol    Past Surgical History:  Procedure Laterality Date   COLONOSCOPY  03/21/2020   Multiple NONadenomatous polyps->recall 2032.   vasectomy     VASECTOMY REVERSAL      Family History  Problem Relation Age of Onset   Cancer Mother        Peritoneal Cancer   Hypertension Father    Stroke Father    Lung cancer Father    Asthma Sister    Alcohol abuse Brother    Depression Brother    Drug abuse Brother    Early death Brother    Heart attack Maternal Grandmother    Stroke Maternal Grandmother    Stroke Maternal Grandfather    Heart disease Maternal Grandfather    Asthma Daughter    Colon cancer Neg Hx    Colon polyps Neg Hx    Esophageal cancer Neg Hx    Rectal cancer Neg Hx    Stomach cancer Neg Hx    Social History:  reports that he quit smoking about 3 weeks ago. His smoking use included cigarettes. He has a 20 pack-year smoking history. He has never used smokeless tobacco. He reports that he does not currently use alcohol. He reports that he does not use drugs.  Allergies: No Known Allergies  No medications prior to admission.    No results found for this or any previous visit  (from the past 48 hours). No results found.  Review of Systems no recent fever, chills, nausea, vomiting or changes in his appetite  Height 5' 6 (1.676 m), weight 68 kg. Physical Exam  Well-nourished well-developed man in no apparent distress.  Alert and oriented.  Normal mood and affect.  Gait is nonweightbearing on the left.  Left ankle is swollen and ecchymotic.  Tender to palpation at the lateral malleolus.  Skin is otherwise healthy and intact.  Pulses are palpable.  No lymphadenopathy. Assessment/Plan Displaced and unstable left ankle lateral malleolus fracture -to the operating room today for open treatment with internal fixation.  The risks and benefits of the alternative treatment options have been discussed in detail.  The patient wishes to proceed with surgery and specifically understands risks of bleeding, infection, nerve damage, blood clots, need for additional surgery, amputation and death.   Norleen Armor, MD 02-18-2023, 10:04 AM

## 2023-02-18 ENCOUNTER — Encounter (HOSPITAL_BASED_OUTPATIENT_CLINIC_OR_DEPARTMENT_OTHER): Payer: Self-pay | Admitting: Orthopedic Surgery

## 2023-02-18 MED ORDER — ROPIVACAINE HCL 5 MG/ML IJ SOLN
INTRAMUSCULAR | Status: DC | PRN
  Administered 2023-02-17: 20 mL via PERINEURAL

## 2023-02-18 MED ORDER — BUPIVACAINE HCL (PF) 0.5 % IJ SOLN
INTRAMUSCULAR | Status: DC | PRN
  Administered 2023-02-17: 20 mL via PERINEURAL

## 2023-02-18 MED ORDER — DEXAMETHASONE SODIUM PHOSPHATE 10 MG/ML IJ SOLN
INTRAMUSCULAR | Status: DC | PRN
  Administered 2023-02-17: 5 mg

## 2023-02-18 MED ORDER — BUPIVACAINE LIPOSOME 1.3 % IJ SUSP
INTRAMUSCULAR | Status: DC | PRN
  Administered 2023-02-17: 10 mL via PERINEURAL

## 2023-02-18 NOTE — Anesthesia Procedure Notes (Signed)
 Anesthesia Regional Block: Popliteal block   Pre-Anesthetic Checklist: , timeout performed,  Correct Patient, Correct Site, Correct Laterality,  Correct Procedure, Correct Position, site marked,  Risks and benefits discussed,  Pre-op evaluation,  At surgeon's request and post-op pain management  Laterality: Left  Prep: Maximum Sterile Barrier Precautions used, chloraprep       Needles:  Injection technique: Single-shot  Needle Type: Echogenic Stimulator Needle     Needle Length: 9cm  Needle Gauge: 21     Additional Needles:   Procedures:,,,, ultrasound used (permanent image in chart),,    Narrative:  Start time: 02/17/2023 12:20 PM End time: 02/17/2023 12:25 PM Injection made incrementally with aspirations every 5 mL. Anesthesiologist: Niels Marien CROME, MD

## 2023-02-18 NOTE — Anesthesia Postprocedure Evaluation (Signed)
 Anesthesia Post Note  Patient: [name]  Procedure(s) Performed: OPEN REDUCTION INTERNAL FIXATION (ORIF) lateral malleolus ANKLE FRACTURE (Left: Ankle)     Patient location during evaluation: PACU Anesthesia Type: Regional and General Level of consciousness: awake and alert Pain management: pain level controlled Vital Signs Assessment: post-procedure vital signs reviewed and stable Respiratory status: spontaneous breathing, nonlabored ventilation, respiratory function stable and patient connected to nasal cannula oxygen Cardiovascular status: blood pressure returned to baseline and stable Postop Assessment: no apparent nausea or vomiting Anesthetic complications: no  No notable events documented.  Last Vitals:  Vitals:   02/17/23 1400 02/17/23 1415  BP: 99/65 115/72  Pulse: 78 77  Resp: 17 16  Temp:  (!) 36.3 C  SpO2: 93% 99%    Last Pain:  Vitals:   02/18/23 0943  TempSrc:   PainSc: 0-No pain   Pain Goal: Patients Stated Pain Goal: 3 (02/17/23 1400)                 Minnetta Sandora L Oswaldo Cueto

## 2023-02-18 NOTE — Anesthesia Procedure Notes (Addendum)
 Anesthesia Regional Block: Adductor canal block   Pre-Anesthetic Checklist: , timeout performed,  Correct Patient, Correct Site, Correct Laterality,  Correct Procedure, Correct Position, site marked,  Risks and benefits discussed,  Pre-op evaluation,  At surgeon's request and post-op pain management  Laterality: Left  Prep: Maximum Sterile Barrier Precautions used, chloraprep       Needles:  Injection technique: Single-shot  Needle Type: Echogenic Stimulator Needle     Needle Length: 9cm  Needle Gauge: 21     Additional Needles:   Procedures:,,,, ultrasound used (permanent image in chart),,    Narrative:  Start time: 02/17/2023 12:25 PM End time: 02/17/2023 12:29 PM Injection made incrementally with aspirations every 5 mL. Anesthesiologist: Niels Marien CROME, MD

## 2023-03-02 DIAGNOSIS — S8262XD Displaced fracture of lateral malleolus of left fibula, subsequent encounter for closed fracture with routine healing: Secondary | ICD-10-CM | POA: Diagnosis not present

## 2023-04-04 DIAGNOSIS — S8262XA Displaced fracture of lateral malleolus of left fibula, initial encounter for closed fracture: Secondary | ICD-10-CM | POA: Diagnosis not present

## 2023-04-06 DIAGNOSIS — S8262XD Displaced fracture of lateral malleolus of left fibula, subsequent encounter for closed fracture with routine healing: Secondary | ICD-10-CM | POA: Diagnosis not present

## 2023-04-18 DIAGNOSIS — S8262XD Displaced fracture of lateral malleolus of left fibula, subsequent encounter for closed fracture with routine healing: Secondary | ICD-10-CM | POA: Diagnosis not present

## 2023-04-25 DIAGNOSIS — S8262XD Displaced fracture of lateral malleolus of left fibula, subsequent encounter for closed fracture with routine healing: Secondary | ICD-10-CM | POA: Diagnosis not present

## 2023-04-28 DIAGNOSIS — J069 Acute upper respiratory infection, unspecified: Secondary | ICD-10-CM | POA: Diagnosis not present

## 2023-04-28 DIAGNOSIS — Z03818 Encounter for observation for suspected exposure to other biological agents ruled out: Secondary | ICD-10-CM | POA: Diagnosis not present

## 2023-05-02 DIAGNOSIS — S8262XD Displaced fracture of lateral malleolus of left fibula, subsequent encounter for closed fracture with routine healing: Secondary | ICD-10-CM | POA: Diagnosis not present

## 2023-05-06 DIAGNOSIS — S8262XD Displaced fracture of lateral malleolus of left fibula, subsequent encounter for closed fracture with routine healing: Secondary | ICD-10-CM | POA: Diagnosis not present

## 2023-05-09 DIAGNOSIS — S8262XD Displaced fracture of lateral malleolus of left fibula, subsequent encounter for closed fracture with routine healing: Secondary | ICD-10-CM | POA: Diagnosis not present

## 2023-05-18 NOTE — Patient Instructions (Incomplete)

## 2023-05-20 ENCOUNTER — Ambulatory Visit (INDEPENDENT_AMBULATORY_CARE_PROVIDER_SITE_OTHER): Payer: BLUE CROSS/BLUE SHIELD | Admitting: Family Medicine

## 2023-05-20 VITALS — BP 125/81 | HR 66 | Ht 66.0 in | Wt 152.6 lb

## 2023-05-20 DIAGNOSIS — R590 Localized enlarged lymph nodes: Secondary | ICD-10-CM | POA: Diagnosis not present

## 2023-05-20 DIAGNOSIS — E7800 Pure hypercholesterolemia, unspecified: Secondary | ICD-10-CM | POA: Diagnosis not present

## 2023-05-20 DIAGNOSIS — F900 Attention-deficit hyperactivity disorder, predominantly inattentive type: Secondary | ICD-10-CM

## 2023-05-20 DIAGNOSIS — I10 Essential (primary) hypertension: Secondary | ICD-10-CM | POA: Diagnosis not present

## 2023-05-20 DIAGNOSIS — Z125 Encounter for screening for malignant neoplasm of prostate: Secondary | ICD-10-CM

## 2023-05-20 DIAGNOSIS — Z23 Encounter for immunization: Secondary | ICD-10-CM

## 2023-05-20 DIAGNOSIS — Z0000 Encounter for general adult medical examination without abnormal findings: Secondary | ICD-10-CM | POA: Diagnosis not present

## 2023-05-20 LAB — CBC WITH DIFFERENTIAL/PLATELET
Basophils Absolute: 0.1 10*3/uL (ref 0.0–0.1)
Basophils Relative: 0.9 % (ref 0.0–3.0)
Eosinophils Absolute: 0.2 10*3/uL (ref 0.0–0.7)
Eosinophils Relative: 2.6 % (ref 0.0–5.0)
HCT: 49.6 % (ref 39.0–52.0)
Hemoglobin: 16.6 g/dL (ref 13.0–17.0)
Lymphocytes Relative: 29.9 % (ref 12.0–46.0)
Lymphs Abs: 2.4 10*3/uL (ref 0.7–4.0)
MCHC: 33.6 g/dL (ref 30.0–36.0)
MCV: 89.6 fl (ref 78.0–100.0)
Monocytes Absolute: 0.7 10*3/uL (ref 0.1–1.0)
Monocytes Relative: 8.4 % (ref 3.0–12.0)
Neutro Abs: 4.6 10*3/uL (ref 1.4–7.7)
Neutrophils Relative %: 58.2 % (ref 43.0–77.0)
Platelets: 266 10*3/uL (ref 150.0–400.0)
RBC: 5.53 Mil/uL (ref 4.22–5.81)
RDW: 14.7 % (ref 11.5–15.5)
WBC: 7.9 10*3/uL (ref 4.0–10.5)

## 2023-05-20 LAB — COMPREHENSIVE METABOLIC PANEL WITH GFR
ALT: 25 U/L (ref 0–53)
AST: 19 U/L (ref 0–37)
Albumin: 4.7 g/dL (ref 3.5–5.2)
Alkaline Phosphatase: 100 U/L (ref 39–117)
BUN: 15 mg/dL (ref 6–23)
CO2: 31 meq/L (ref 19–32)
Calcium: 9.7 mg/dL (ref 8.4–10.5)
Chloride: 97 meq/L (ref 96–112)
Creatinine, Ser: 0.82 mg/dL (ref 0.40–1.50)
GFR: 102.23 mL/min (ref >60.00)
Glucose, Bld: 81 mg/dL (ref 70–99)
Potassium: 4.3 meq/L (ref 3.5–5.1)
Sodium: 137 meq/L (ref 135–145)
Total Bilirubin: 0.8 mg/dL (ref 0.2–1.2)
Total Protein: 7.7 g/dL (ref 6.0–8.3)

## 2023-05-20 LAB — LIPID PANEL
Cholesterol: 165 mg/dL (ref 0–200)
HDL: 40.7 mg/dL (ref >39.00)
LDL Cholesterol: 90 mg/dL (ref 0–99)
NonHDL: 124.09
Total CHOL/HDL Ratio: 4
Triglycerides: 168 mg/dL — ABNORMAL HIGH (ref 0.0–149.0)
VLDL: 33.6 mg/dL (ref 0.0–40.0)

## 2023-05-20 LAB — TSH: TSH: 3.26 u[IU]/mL (ref 0.35–5.50)

## 2023-05-20 LAB — PSA: PSA: 0.91 ng/mL (ref 0.10–4.00)

## 2023-05-20 MED ORDER — METHYLPHENIDATE HCL ER (OSM) 27 MG PO TBCR: 27.0000 mg | EXTENDED_RELEASE_TABLET | ORAL | 0 refills | Status: AC

## 2023-05-20 MED ORDER — HYDROCHLOROTHIAZIDE 25 MG PO TABS
25.0000 mg | ORAL_TABLET | Freq: Every day | ORAL | 3 refills | Status: AC
Start: 2023-05-20 — End: ?

## 2023-05-20 MED ORDER — LISINOPRIL 20 MG PO TABS: 20.0000 mg | ORAL_TABLET | Freq: Every day | ORAL | 3 refills | Status: AC

## 2023-05-20 MED ORDER — ATORVASTATIN CALCIUM 20 MG PO TABS: 20.0000 mg | ORAL_TABLET | Freq: Every day | ORAL | 3 refills | Status: AC

## 2023-05-20 NOTE — Progress Notes (Addendum)
 Office Note 05/20/2023  CC:  Chief Complaint  Patient presents with   Annual Exam    Pt is fasting, had a little mountain dew to drink    HPI:  Patient is a 53 y.o. male who is here for annual health maintenance exam and 27-month follow-up hypertension, hyperlipidemia, and erythrocytosis.  It has been a tough year for him.  His father passed away from lung cancer. His wife is currently in the hospital after having surgery for a mass. He fractured his ankle last year as well.  About 3 weeks ago he had a fever and sore throat and bodyaches and presented to the urgent care where labs were negative.  His symptoms resolved appropriately for viral syndrome but he had some swelling in the right side of his neck that, although improved/smaller, is still present.  It does not hurt.  He does still smoke.  He quit on and off but stressful life situations lead to him starting again.  He has had a diagnosis of ADHD all his life.  He took stimulants in middle school and high school.  The stimulant was Ritalin and he feels like it did significantly improve his symptoms.  He has not been on the medication in decades but has gradually noted worsening distractibility, impaired concentration, forgetfulness, restlessness. The symptoms are beginning to affect his job.  It does affect his home and social life but not to the point where he would call it "impairment". He does not drink much caffeine. He denies depressed mood.  Past Medical History:  Diagnosis Date   Arthritis    Elevated transaminase level    Viral Hep serologies neg 2020.  Abd u/s normal 11/2018.   Hyperlipidemia, mixed 2020/21   Great response to atorva 20   Hypertension    Polycythemia    genetic hemochromatosis test neg.  +Elevated ferritin and %sat.  + smoker.  Not on testost and no s/s of OSA.  Hematol eval 11/2018->reactive changes from alc abuse + tobacco suspected as cause, abd u/s normal.   Tobacco dependence     tobacco/alchol    Past Surgical History:  Procedure Laterality Date   COLONOSCOPY  03/21/2020   Multiple NONadenomatous polyps->recall 2032.   ORIF ANKLE FRACTURE Left 02/17/2023   Procedure: OPEN REDUCTION INTERNAL FIXATION (ORIF) lateral malleolus ANKLE FRACTURE;  Surgeon: Toni Arthurs, MD;  Location: [facility];  Service: Orthopedics;  Laterality: Left;  general, regional (pop block with exparel)   vasectomy     VASECTOMY REVERSAL      Family History  Problem Relation Age of Onset   Cancer Mother        Peritoneal Cancer   Hypertension Father    Stroke Father    Lung cancer Father    Asthma Sister    Alcohol abuse Brother    Depression Brother    Drug abuse Brother    Early death Brother    Heart attack Maternal Grandmother    Stroke Maternal Grandmother    Stroke Maternal Grandfather    Heart disease Maternal Grandfather    Asthma Daughter    Colon cancer Neg Hx    Colon polyps Neg Hx    Esophageal cancer Neg Hx    Rectal cancer Neg Hx    Stomach cancer Neg Hx     Social History   Socioeconomic History   Marital status: Married    Spouse name: Not on file   Number of children: Not on file  Years of education: Not on file   Highest education level: Some college, no degree  Occupational History   Not on file  Tobacco Use   Smoking status: Former    Current packs/day: 0.00    Average packs/day: 1 pack/day for 20.0 years (20.0 ttl pk-yrs)    Types: Cigarettes    Quit date: 01/24/2023    Years since quitting: 0.3   Smokeless tobacco: Never  Vaping Use   Vaping status: Former  Substance and Sexual Activity   Alcohol use: Not Currently    Comment: 1xweek   Drug use: Never   Sexual activity: Not on file  Other Topics Concern   Not on file  Social History Narrative   Married, 5 kids.   Educ: some college   Occup: sales with [employer]   Tob: 21 pack-yr hx, current as of 12/2019.   Alc: about 2 liquor drinks per night.   No drugs.    Social Drivers of Corporate investment banker Strain: Low Risk  (05/20/2023)   Overall Financial Resource Strain (CARDIA)    Difficulty of Paying Living Expenses: Not hard at all  Food Insecurity: No Food Insecurity (05/20/2023)   Hunger Vital Sign    Worried About Running Out of Food in the Last Year: Never true    Ran Out of Food in the Last Year: Never true  Transportation Needs: No Transportation Needs (05/20/2023)   PRAPARE - Administrator, Civil Service (Medical): No    Lack of Transportation (Non-Medical): No  Physical Activity: Insufficiently Active (05/20/2023)   Exercise Vital Sign    Days of Exercise per Week: 1 day    Minutes of Exercise per Session: 10 min  Stress: Stress Concern Present (05/20/2023)   [employer] of Occupational Health - Occupational Stress Questionnaire    Feeling of Stress : To some extent  Social Connections: Moderately Isolated (05/20/2022)   Social Connection and Isolation Panel [NHANES]    Frequency of Communication with Friends and Family: Three times a week    Frequency of Social Gatherings with Friends and Family: Once a week    Attends Religious Services: Never    Database administrator or Organizations: No    Attends Engineer, structural: Not on file    Marital Status: Married  Catering manager Violence: Not on file    Outpatient Medications Prior to Visit  Medication Sig Dispense Refill   atorvastatin (LIPITOR) 20 MG tablet Take 1 tablet (20 mg total) by mouth daily. 90 tablet 3   hydrochlorothiazide (HYDRODIURIL) 25 MG tablet Take 1 tablet (25 mg total) by mouth daily. 90 tablet 3   lisinopril (ZESTRIL) 20 MG tablet Take 1 tablet (20 mg total) by mouth daily. 30 tablet 11   varenicline (CHANTIX) 1 MG tablet Take 1 mg by mouth 2 (two) times daily.     EPINEPHrine 0.3 mg/0.3 mL IJ SOAJ injection Inject 0.3 mg into the muscle as needed for anaphylaxis. (Patient not taking: Reported on 05/20/2023) 2 each 1   No  facility-administered medications prior to visit.    No Known Allergies  Review of Systems  Constitutional:  Negative for appetite change, chills, fatigue and fever.  HENT:  Negative for congestion, dental problem, ear pain and sore throat.   Eyes:  Negative for discharge, redness and visual disturbance.  Respiratory:  Negative for cough, chest tightness, shortness of breath and wheezing.   Cardiovascular:  Negative for chest pain, palpitations and leg  swelling.  Gastrointestinal:  Negative for abdominal pain, blood in stool, diarrhea, nausea and vomiting.  Genitourinary:  Negative for difficulty urinating, dysuria, flank pain, frequency, hematuria and urgency.  Musculoskeletal:  Negative for arthralgias, back pain, joint swelling, myalgias and neck stiffness.  Skin:  Negative for pallor and rash.  Neurological:  Negative for dizziness, speech difficulty, weakness and headaches.  Hematological:  Negative for adenopathy. Does not bruise/bleed easily.  Psychiatric/Behavioral:  Negative for confusion and sleep disturbance. The patient is not nervous/anxious.    PE;    05/20/2023    9:33 AM 02/17/2023    2:15 PM 02/17/2023    2:00 PM  Vitals with BMI  Height 5\' 6"     Weight 152 lbs 10 oz    BMI 24.64    Systolic 125 115 99  Diastolic 81 72 65  Pulse 66 77 78    Gen: Alert, well appearing.  Patient is oriented to person, place, time, and situation. AFFECT: pleasant, lucid thought and speech. ENT: Ears: EACs clear, normal epithelium.  TMs with good light reflex and landmarks bilaterally.  Eyes: no injection, icteris, swelling, or exudate.  EOMI, PERRLA. Nose: no drainage or turbinate edema/swelling.  No injection or focal lesion.  Mouth: lips without lesion/swelling.  Oral mucosa pink and moist.  Dentition intact and without obvious caries or gingival swelling.  Oropharynx without erythema, exudate, or swelling.  Neck: Right anterior cervical lymph node is palpable, approximately acorn  size, soft and rubbery and mobile.  Nontender. Carotid pulses 2+ bilaterally, without bruits. CV: RRR, no m/r/g.   LUNGS: CTA bilat, nonlabored resps, good aeration in all lung fields. ABD: soft, NT, ND, BS normal.  No hepatospenomegaly or mass.  No bruits. EXT: no clubbing, cyanosis, or edema.  Musculoskeletal: no joint swelling, erythema, warmth, or tenderness.  ROM of all joints intact. Skin - no sores or suspicious lesions or rashes or color changes  Pertinent labs:  Lab Results  Component Value Date   TSH 2.54 05/20/2022   Lab Results  Component Value Date   WBC 7.7 05/20/2022   HGB 16.4 05/20/2022   HCT 47.5 05/20/2022   MCV 90.1 05/20/2022   PLT 221.0 05/20/2022   Lab Results  Component Value Date   IRON 102 10/17/2020   TIBC 360 10/17/2020   FERRITIN 273 10/17/2020   Lab Results  Component Value Date   CREATININE 0.96 02/14/2023   BUN 18 02/14/2023   NA 134 (L) 02/14/2023   K 4.3 02/14/2023   CL 100 02/14/2023   CO2 26 02/14/2023   Lab Results  Component Value Date   ALT 33 12/06/2022   AST 30 12/06/2022   ALKPHOS 85 05/20/2022   BILITOT 0.7 12/06/2022   Lab Results  Component Value Date   CHOL 145 12/06/2022   Lab Results  Component Value Date   HDL 43 12/06/2022   Lab Results  Component Value Date   LDLCALC 70 12/06/2022   Lab Results  Component Value Date   TRIG 220 (H) 12/06/2022   Lab Results  Component Value Date   CHOLHDL 3.4 12/06/2022   Lab Results  Component Value Date   PSA 0.66 05/20/2022   Lab Results  Component Value Date   HGBA1C 5.1 01/09/2019   ASSESSMENT AND PLAN:   #1 health maintenance exam: Reviewed age and gender appropriate health maintenance issues (prudent diet, regular exercise, health risks of tobacco and excessive alcohol, use of seatbelts, fire alarms in home, use of sunscreen).  Also  reviewed age and gender appropriate health screening as well as vaccine recommendations. Vaccines: Shingrix-->declined.  otherwise up-to-date. Labs: Fasting health panel, PSA Prostate ca screening: PSA today Colon ca screening: Recall 2032 Lung cancer screening: His father died of lung cancer recently.  Imir still smokes. I did not discuss the topic of lung cancer screening CT today but I will bring this up at his follow-up in 1 month.  #2 ADD. Start methylphenidate CR 27 mg daily. Therapeutic expectations and side effect profile of medication discussed today.  Patient's questions answered.  #3 right anterior cervical lymphadenopathy.  Improving status post viral syndrome 3 weeks ago.  Will recheck this at next follow-up in 1 month.  Reassured pt that this process is very likely reactive at this point.  4.  Hypertension, well-controlled on lisinopril 20 mg a day and HCTZ 25 mg a day. Electrolytes and creatinine monitoring today.  5.  Hypercholesterolemia, doing well on atorvastatin 20 mg a day. Lipid panel and hepatic panel today.  6.  Tobacco dependence. He is working on cessation.  He has Chantix.   An After Visit Summary was printed and given to the patient.  FOLLOW UP:  No follow-ups on file.  Signed:  Santiago Bumpers, MD           05/20/2023

## 2023-05-22 ENCOUNTER — Encounter: Payer: Self-pay | Admitting: Family Medicine

## 2023-06-17 ENCOUNTER — Ambulatory Visit: Admitting: Family Medicine

## 2023-06-17 NOTE — Progress Notes (Deleted)
 OFFICE VISIT  06/17/2023  CC: No chief complaint on file.   Patient is a 53 y.o. male who presents for 1 month follow-up cervical lymphadenopathy and adult ADD. A/P as of last visit: "#1 ADD. Start methylphenidate  CR 27 mg daily. Therapeutic expectations and side effect profile of medication discussed today.  Patient's questions answered.   #2 right anterior cervical lymphadenopathy.  Improving status post viral syndrome 3 weeks ago.  Will recheck this at next follow-up in 1 month.  Reassured pt that this process is very likely reactive at this point.   3.  Hypertension, well-controlled on lisinopril  20 mg a day and HCTZ 25 mg a day. Electrolytes and creatinine monitoring today.   4  Hypercholesterolemia, doing well on atorvastatin  20 mg a day. Lipid panel and hepatic panel today.   5  Tobacco dependence. He is working on cessation.  He has Chantix ."  INTERIM HX: Lab results great last visit.  No changes made.  ***  Past Medical History:  Diagnosis Date   Arthritis    Elevated transaminase level    Viral Hep serologies neg 2020.  Abd u/s normal 11/2018.   Hyperlipidemia, mixed 2020/21   Great response to atorva 20   Hypertension    Polycythemia    genetic hemochromatosis test neg.  +Elevated ferritin and %sat.  + smoker.  Not on testost and no s/s of OSA.  Hematol eval 11/2018->reactive changes from alc abuse + tobacco suspected as cause, abd u/s normal.   Tobacco dependence    tobacco/alchol    Past Surgical History:  Procedure Laterality Date   COLONOSCOPY  03/21/2020   Multiple NONadenomatous polyps->recall 2032.   ORIF ANKLE FRACTURE Left 02/17/2023   Procedure: OPEN REDUCTION INTERNAL FIXATION (ORIF) lateral malleolus ANKLE FRACTURE;  Surgeon: Amada Backer, MD;  Location: [facility];  Service: Orthopedics;  Laterality: Left;  general, regional (pop block with exparel )   vasectomy     VASECTOMY REVERSAL      Outpatient Medications Prior to Visit   Medication Sig Dispense Refill   atorvastatin  (LIPITOR) 20 MG tablet Take 1 tablet (20 mg total) by mouth daily. 90 tablet 3   EPINEPHrine  0.3 mg/0.3 mL IJ SOAJ injection Inject 0.3 mg into the muscle as needed for anaphylaxis. (Patient not taking: Reported on 05/20/2023) 2 each 1   hydrochlorothiazide  (HYDRODIURIL ) 25 MG tablet Take 1 tablet (25 mg total) by mouth daily. 90 tablet 3   lisinopril  (ZESTRIL ) 20 MG tablet Take 1 tablet (20 mg total) by mouth daily. 90 tablet 3   methylphenidate  (CONCERTA ) 27 MG PO CR tablet Take 1 tablet (27 mg total) by mouth every morning. 30 tablet 0   varenicline  (CHANTIX ) 1 MG tablet Take 1 mg by mouth 2 (two) times daily.     No facility-administered medications prior to visit.    No Known Allergies  Review of Systems As per HPI  PE:    05/20/2023    9:33 AM 02/17/2023    2:15 PM 02/17/2023    2:00 PM  Vitals with BMI  Height 5\' 6"     Weight 152 lbs 10 oz    BMI 24.64    Systolic 125 115 99  Diastolic 81 72 65  Pulse 66 77 78     Physical Exam  ***  LABS:  Last CBC Lab Results  Component Value Date   WBC 7.9 05/20/2023   HGB 16.6 05/20/2023   HCT 49.6 05/20/2023   MCV 89.6 05/20/2023  MCH 30.1 10/17/2020   RDW 14.7 05/20/2023   PLT 266.0 05/20/2023   Last metabolic panel Lab Results  Component Value Date   GLUCOSE 81 05/20/2023   NA 137 05/20/2023   K 4.3 05/20/2023   CL 97 05/20/2023   CO2 31 05/20/2023   BUN 15 05/20/2023   CREATININE 0.82 05/20/2023   GFR 102.23 05/20/2023   CALCIUM  9.7 05/20/2023   PROT 7.7 05/20/2023   ALBUMIN 4.7 05/20/2023   BILITOT 0.8 05/20/2023   ALKPHOS 100 05/20/2023   AST 19 05/20/2023   ALT 25 05/20/2023   ANIONGAP 8 02/14/2023   Last lipids Lab Results  Component Value Date   CHOL 165 05/20/2023   HDL 40.70 05/20/2023   LDLCALC 90 05/20/2023   LDLDIRECT 160.0 03/14/2019   TRIG 168.0 (H) 05/20/2023   CHOLHDL 4 05/20/2023   Last hemoglobin A1c Lab Results  Component Value Date    HGBA1C 5.1 01/09/2019   Last thyroid  functions Lab Results  Component Value Date   TSH 3.26 05/20/2023   IMPRESSION AND PLAN:  No problem-specific Assessment & Plan notes found for this encounter.  ?  Lung cancer screening  An After Visit Summary was printed and given to the patient.  FOLLOW UP: No follow-ups on file.  Signed:  Arletha Lady, MD           06/17/2023
